# Patient Record
Sex: Female | Born: 1940 | Hispanic: No | Marital: Married | Smoking: Never smoker
Health system: Southern US, Community
[De-identification: ages and names within clinical notes are randomized; demographics above are authoritative.]

## PROBLEM LIST (undated history)

## (undated) DIAGNOSIS — I639 Cerebral infarction, unspecified: Secondary | ICD-10-CM

## (undated) DIAGNOSIS — E785 Hyperlipidemia, unspecified: Secondary | ICD-10-CM

## (undated) DIAGNOSIS — F329 Major depressive disorder, single episode, unspecified: Secondary | ICD-10-CM

## (undated) DIAGNOSIS — F32A Depression, unspecified: Secondary | ICD-10-CM

## (undated) HISTORY — DX: Depression, unspecified: F32.A

## (undated) HISTORY — DX: Cerebral infarction, unspecified: I63.9

## (undated) HISTORY — DX: Major depressive disorder, single episode, unspecified: F32.9

---

## 1999-04-10 ENCOUNTER — Other Ambulatory Visit: Admission: RE | Admit: 1999-04-10 | Discharge: 1999-04-10 | Payer: Self-pay | Admitting: Obstetrics & Gynecology

## 1999-08-01 ENCOUNTER — Other Ambulatory Visit: Admission: RE | Admit: 1999-08-01 | Discharge: 1999-08-01 | Payer: Self-pay | Admitting: Obstetrics & Gynecology

## 2000-10-13 ENCOUNTER — Other Ambulatory Visit: Admission: RE | Admit: 2000-10-13 | Discharge: 2000-10-13 | Payer: Self-pay | Admitting: Obstetrics & Gynecology

## 2001-10-14 ENCOUNTER — Other Ambulatory Visit: Admission: RE | Admit: 2001-10-14 | Discharge: 2001-10-14 | Payer: Self-pay | Admitting: Obstetrics & Gynecology

## 2002-11-08 ENCOUNTER — Other Ambulatory Visit: Admission: RE | Admit: 2002-11-08 | Discharge: 2002-11-08 | Payer: Self-pay | Admitting: Obstetrics & Gynecology

## 2003-11-16 ENCOUNTER — Other Ambulatory Visit: Admission: RE | Admit: 2003-11-16 | Discharge: 2003-11-16 | Payer: Self-pay | Admitting: Obstetrics & Gynecology

## 2004-11-05 ENCOUNTER — Other Ambulatory Visit: Admission: RE | Admit: 2004-11-05 | Discharge: 2004-11-05 | Payer: Self-pay | Admitting: Obstetrics & Gynecology

## 2005-11-12 ENCOUNTER — Other Ambulatory Visit: Admission: RE | Admit: 2005-11-12 | Discharge: 2005-11-12 | Payer: Self-pay | Admitting: Obstetrics & Gynecology

## 2008-04-02 ENCOUNTER — Encounter: Admission: RE | Admit: 2008-04-02 | Discharge: 2008-04-02 | Payer: Self-pay | Admitting: Family Medicine

## 2012-01-26 ENCOUNTER — Other Ambulatory Visit (HOSPITAL_COMMUNITY): Payer: Self-pay | Admitting: Gastroenterology

## 2012-01-26 ENCOUNTER — Ambulatory Visit (HOSPITAL_COMMUNITY)
Admission: RE | Admit: 2012-01-26 | Discharge: 2012-01-26 | Disposition: A | Discharge status: Home / Self Care | Payer: Medicare Other | Source: Ambulatory Visit | Attending: Gastroenterology | Admitting: Gastroenterology

## 2012-01-26 DIAGNOSIS — R933 Abnormal findings on diagnostic imaging of other parts of digestive tract: Secondary | ICD-10-CM

## 2012-01-26 DIAGNOSIS — K6389 Other specified diseases of intestine: Secondary | ICD-10-CM | POA: Insufficient documentation

## 2012-01-26 DIAGNOSIS — Z09 Encounter for follow-up examination after completed treatment for conditions other than malignant neoplasm: Secondary | ICD-10-CM | POA: Insufficient documentation

## 2014-10-05 ENCOUNTER — Other Ambulatory Visit (HOSPITAL_COMMUNITY): Payer: Self-pay | Admitting: Family Medicine

## 2014-10-05 DIAGNOSIS — R1310 Dysphagia, unspecified: Secondary | ICD-10-CM

## 2014-10-10 ENCOUNTER — Ambulatory Visit (HOSPITAL_COMMUNITY): Payer: Medicare Other

## 2014-10-11 ENCOUNTER — Ambulatory Visit (HOSPITAL_COMMUNITY)
Admission: RE | Admit: 2014-10-11 | Discharge: 2014-10-11 | Disposition: A | Discharge status: Home / Self Care | Payer: Medicare Other | Source: Ambulatory Visit | Attending: Family Medicine | Admitting: Family Medicine

## 2014-10-11 DIAGNOSIS — R05 Cough: Secondary | ICD-10-CM | POA: Insufficient documentation

## 2014-10-11 DIAGNOSIS — R1310 Dysphagia, unspecified: Secondary | ICD-10-CM | POA: Insufficient documentation

## 2014-10-11 NOTE — Procedures (Signed)
Objective Swallowing Evaluation: Modified Barium Swallowing Study  Patient Details  Name: Rhonda Fuller MRN: [id number] Date of Birth: 06/21/1941  Today's Date: 10/11/2014 Time: 1300-1330 SLP Time Calculation (min): 30 min  Past Medical History: No past medical history on file. Past Surgical History: No past surgical history on file. HPI:  This 73 year old female was referred by Dr. Jeannetta NapElkins for OP MBS secondary to pt c/o frequent coughing during meals.  Pt also states she has heartburn and a stinging sensation, pointing to her sternum up to her hyoid, after eating.  No other significant PMH reported.     Assessment / Plan / Recommendation Clinical Impression  Dysphagia Diagnosis: Suspected primary esophageal dysphagia Clinical impression: Pt exhibits normal oropharyngeal swallow function, with no difficulty chewing, forming and manipulating solid or liquid boluses, timely swallow initiation, no penetration or aspiration, and no pharyngeal residue after the swallow.  The patient had a dry, frequent cough multiple times during this study, but there was no evidence of aspiration or penetration.  Upon esophageal sweep, after swallowing a pill, esophageal stasis was noted, with suspected decreased esophageal perestalsis.  SLP defered esophageal assesment to the Radiologist, who stated further evaluation may be needed.      Treatment Recommendation  No treatment recommended at this time    Diet Recommendation Regular;Thin liquid   Liquid Administration via: Cup;Straw Medication Administration: Whole meds with liquid Supervision: Patient able to self feed Compensations: Slow rate;Small sips/bites;Follow solids with liquid Postural Changes and/or Swallow Maneuvers: Seated upright 90 degrees;Upright 30-60 min after meal    Other  Recommendations Recommended Consults: Consider GI evaluation;Consider esophageal assessment Oral Care Recommendations: Oral care BID Other Recommendations: Clarify  dietary restrictions   Follow Up Recommendations  None    Frequency and Duration        Pertinent Vitals/Pain n/a         General HPI: This 73 year old female was referred by Dr. Jeannetta NapElkins for OP MBS secondary to pt c/o frequent coughing during meals.  Pt also states she has heartburn and a stinging sensation, pointing to her sternum up to her hyoid, after eating.  No other significant PMH reported. Type of Study: Modified Barium Swallowing Study Reason for Referral: Objectively evaluate swallowing function Previous Swallow Assessment: none Diet Prior to this Study: Regular;Thin liquids Temperature Spikes Noted: No Respiratory Status: Room air History of Recent Intubation: No Behavior/Cognition: Alert;Cooperative;Pleasant mood Oral Cavity - Dentition: Adequate natural dentition Oral Motor / Sensory Function: Within functional limits Self-Feeding Abilities: Able to feed self Patient Positioning: Upright in chair Baseline Vocal Quality: Clear Volitional Cough: Strong Volitional Swallow: Able to elicit Anatomy: Within functional limits Pharyngeal Secretions: Not observed secondary MBS    Reason for Referral Objectively evaluate swallowing function   Oral Phase Oral Preparation/Oral Phase Oral Phase: WFL   Pharyngeal Phase Pharyngeal Phase Pharyngeal Phase: Within functional limits  Cervical Esophageal Phase    GO    Cervical Esophageal Phase Cervical Esophageal [facility]    Functional Assessment Tool Used: MBS, Clinical judgement Functional Limitations: Swallowing Swallow Current Status (W0981(G8996): 0 percent impaired, limited or restricted Swallow Goal Status (X9147(G8997): 0 percent impaired, limited or restricted Swallow Discharge Status (W2956(G8998): 0 percent impaired, limited or restricted    Rhonda Fuller, Rhonda Fuller 10/11/2014, 1:53 PM

## 2015-05-26 ENCOUNTER — Inpatient Hospital Stay (HOSPITAL_COMMUNITY): Payer: Medicare Other

## 2015-05-26 ENCOUNTER — Encounter (HOSPITAL_COMMUNITY): Payer: Self-pay | Admitting: *Deleted

## 2015-05-26 ENCOUNTER — Inpatient Hospital Stay (HOSPITAL_COMMUNITY)
Admission: EM | Admit: 2015-05-26 | Discharge: 2015-05-28 | DRG: 066 | Disposition: A | Discharge status: Home / Self Care | Payer: Medicare Other | Attending: Family Medicine | Admitting: Family Medicine

## 2015-05-26 ENCOUNTER — Emergency Department (HOSPITAL_COMMUNITY): Payer: Medicare Other

## 2015-05-26 DIAGNOSIS — Z79899 Other long term (current) drug therapy: Secondary | ICD-10-CM

## 2015-05-26 DIAGNOSIS — Z823 Family history of stroke: Secondary | ICD-10-CM

## 2015-05-26 DIAGNOSIS — Z885 Allergy status to narcotic agent status: Secondary | ICD-10-CM | POA: Diagnosis not present

## 2015-05-26 DIAGNOSIS — I6340 Cerebral infarction due to embolism of unspecified cerebral artery: Secondary | ICD-10-CM | POA: Diagnosis present

## 2015-05-26 DIAGNOSIS — I10 Essential (primary) hypertension: Secondary | ICD-10-CM | POA: Diagnosis present

## 2015-05-26 DIAGNOSIS — E785 Hyperlipidemia, unspecified: Secondary | ICD-10-CM | POA: Diagnosis present

## 2015-05-26 DIAGNOSIS — K219 Gastro-esophageal reflux disease without esophagitis: Secondary | ICD-10-CM | POA: Diagnosis present

## 2015-05-26 DIAGNOSIS — I639 Cerebral infarction, unspecified: Secondary | ICD-10-CM | POA: Diagnosis present

## 2015-05-26 DIAGNOSIS — I739 Peripheral vascular disease, unspecified: Secondary | ICD-10-CM | POA: Diagnosis present

## 2015-05-26 DIAGNOSIS — E780 Pure hypercholesterolemia: Secondary | ICD-10-CM | POA: Diagnosis present

## 2015-05-26 DIAGNOSIS — R471 Dysarthria and anarthria: Secondary | ICD-10-CM | POA: Diagnosis present

## 2015-05-26 HISTORY — DX: Hyperlipidemia, unspecified: E78.5

## 2015-05-26 LAB — PROTIME-INR
INR: 0.97 (ref 0.00–1.49)
Prothrombin Time: 13.1 seconds (ref 11.6–15.2)

## 2015-05-26 LAB — COMPREHENSIVE METABOLIC PANEL
ALT: 13 U/L — ABNORMAL LOW (ref 14–54)
ANION GAP: 9 (ref 5–15)
AST: 15 U/L (ref 15–41)
Albumin: 4.1 g/dL (ref 3.5–5.0)
Alkaline Phosphatase: 71 U/L (ref 38–126)
BILIRUBIN TOTAL: 0.3 mg/dL (ref 0.3–1.2)
BUN: 12 mg/dL (ref 6–20)
CHLORIDE: 107 mmol/L (ref 101–111)
CO2: 24 mmol/L (ref 22–32)
CREATININE: 0.72 mg/dL (ref 0.44–1.00)
Calcium: 9.6 mg/dL (ref 8.9–10.3)
GFR calc Af Amer: >60 mL/min (ref >60)
Glucose, Bld: 103 mg/dL — ABNORMAL HIGH (ref 65–99)
Potassium: 3.7 mmol/L (ref 3.5–5.1)
Sodium: 140 mmol/L (ref 135–145)
Total Protein: 7.7 g/dL (ref 6.5–8.1)

## 2015-05-26 LAB — CBC
HCT: 42.3 % (ref 36.0–46.0)
HEMOGLOBIN: 14.1 g/dL (ref 12.0–15.0)
MCH: 29.4 pg (ref 26.0–34.0)
MCHC: 33.3 g/dL (ref 30.0–36.0)
MCV: 88.1 fL (ref 78.0–100.0)
Platelets: 324 10*3/uL (ref 150–400)
RBC: 4.8 MIL/uL (ref 3.87–5.11)
RDW: 14 % (ref 11.5–15.5)
WBC: 8.4 10*3/uL (ref 4.0–10.5)

## 2015-05-26 LAB — DIFFERENTIAL
BASOS PCT: 1 % (ref 0–1)
Basophils Absolute: 0.1 10*3/uL (ref 0.0–0.1)
Eosinophils Absolute: 0.2 10*3/uL (ref 0.0–0.7)
Eosinophils Relative: 2 % (ref 0–5)
LYMPHS ABS: 2.5 10*3/uL (ref 0.7–4.0)
LYMPHS PCT: 30 % (ref 12–46)
MONO ABS: 0.7 10*3/uL (ref 0.1–1.0)
MONOS PCT: 8 % (ref 3–12)
NEUTROS ABS: 4.9 10*3/uL (ref 1.7–7.7)
Neutrophils Relative %: 59 % (ref 43–77)

## 2015-05-26 LAB — APTT: aPTT: 30 seconds (ref 24–37)

## 2015-05-26 LAB — CBG MONITORING, ED: GLUCOSE-CAPILLARY: 108 mg/dL — AB (ref 65–99)

## 2015-05-26 LAB — I-STAT TROPONIN, ED: TROPONIN I, POC: 0 ng/mL (ref 0.00–0.08)

## 2015-05-26 MED ORDER — LORAZEPAM 0.5 MG PO TABS
0.5000 mg | ORAL_TABLET | ORAL | Status: DC | PRN
Start: 2015-05-26 — End: 2015-05-28
  Administered 2015-05-26: 0.5 mg via ORAL
  Filled 2015-05-26: qty 1

## 2015-05-26 MED ORDER — HEPARIN SODIUM (PORCINE) 5000 UNIT/ML IJ SOLN
5000.0000 [IU] | Freq: Three times a day (TID) | INTRAMUSCULAR | Status: DC
  Administered 2015-05-26 – 2015-05-28 (×5): 5000 [IU] via SUBCUTANEOUS
  Filled 2015-05-26 (×5): qty 1

## 2015-05-26 MED ORDER — ASPIRIN 325 MG PO TABS
325.0000 mg | ORAL_TABLET | Freq: Every day | ORAL | Status: DC
  Administered 2015-05-26 – 2015-05-28 (×3): 325 mg via ORAL
  Filled 2015-05-26 (×3): qty 1

## 2015-05-26 MED ORDER — STROKE: EARLY STAGES OF RECOVERY BOOK
Freq: Once | Status: AC
  Administered 2015-05-26: 22:00:00

## 2015-05-26 MED ORDER — PANTOPRAZOLE SODIUM 40 MG PO TBEC
40.0000 mg | DELAYED_RELEASE_TABLET | Freq: Every day | ORAL | Status: DC
  Administered 2015-05-27 – 2015-05-28 (×2): 40 mg via ORAL
  Filled 2015-05-26 (×2): qty 1

## 2015-05-26 MED ORDER — PRAVASTATIN SODIUM 40 MG PO TABS
40.0000 mg | ORAL_TABLET | Freq: Every day | ORAL | Status: DC
  Administered 2015-05-26: 40 mg via ORAL
  Filled 2015-05-26: qty 1

## 2015-05-26 MED ORDER — ASPIRIN 300 MG RE SUPP: 300.0000 mg | Freq: Every day | RECTAL | Status: DC

## 2015-05-26 MED ORDER — SENNOSIDES-DOCUSATE SODIUM 8.6-50 MG PO TABS: 1.0000 | ORAL_TABLET | Freq: Every evening | ORAL | Status: DC | PRN

## 2015-05-26 NOTE — ED Provider Notes (Signed)
CSN: [id number]     Arrival date & time 05/26/15  1724 History   First MD Initiated Contact with Patient 05/26/15 1751     Chief Complaint  Patient presents with  . Aphasia  . Extremity Weakness     (Consider location/radiation/quality/duration/timing/severity/associated sxs/prior Treatment) HPI  74 year old female presents with slurred speech since waking up this morning. Last seen normal around 10 PM last night. She has also been leaning to the left. Patient has not noticed dropping anything or left upper extremity weakness. Family states she has not been confused but a definite is a change in her voice. She has a past history of high cholesterol as well as a significant family history for stroke. She has not known of any prior strokes. She has been complaining of a moderate headache for the past 3 days.  Past Medical History  Diagnosis Date  . Hyperlipidemia    History reviewed. No pertinent past surgical history. No family history on file. History  Substance Use Topics  . Smoking status: Never Smoker   . Smokeless tobacco: Not on file  . Alcohol Use: No   OB History    No data available     Review of Systems  Musculoskeletal: Positive for gait problem.  Neurological: Positive for speech difficulty, weakness and headaches.  All other systems reviewed and are negative.     Allergies  Codeine  Home Medications   Prior to Admission medications   Not on File   BP 178/78 mmHg  Pulse 98  Temp(Src) 99.2 F (37.3 C) (Oral)  Resp 16  Ht  (1.575 m)  Wt 140 lb 8 oz (63.73 kg)  BMI 25.69 kg/m2  SpO2 98% Physical Exam  Constitutional: She is oriented to person, place, and time. She appears well-developed and well-nourished.  HENT:  Head: Normocephalic and atraumatic.  Right Ear: External ear normal.  Left Ear: External ear normal.  Nose: Nose normal.  Eyes: EOM are normal. Pupils are equal, round, and reactive to light. Right eye exhibits no discharge. Left  eye exhibits no discharge.  Cardiovascular: Normal rate, regular rhythm and normal heart sounds.   Pulmonary/Chest: Effort normal and breath sounds normal.  Abdominal: Soft. She exhibits no distension. There is no tenderness.  Neurological: She is alert and oriented to person, place, and time.  CN 2-12 grossly intact. No facial droop. Subtle weakness in LLE compared to Right but still 5/5. Normal strength in upper extremities. No pronator drift. Mildly slurred speech  Skin: Skin is warm and dry.  Nursing note and vitals reviewed.   ED Course  Procedures (including critical care time) Labs Review Labs Reviewed  COMPREHENSIVE METABOLIC PANEL - Abnormal; Notable for the following:    Glucose, Bld 103 (*)    ALT 13 (*)    All other components within normal limits  CBG MONITORING, ED - Abnormal; Notable for the following:    Glucose-Capillary 108 (*)    All other components within normal limits  PROTIME-INR  APTT  CBC  DIFFERENTIAL  I-STAT CHEM 8, ED  I-STAT TROPOININ, ED    Imaging Review Ct Head Wo Contrast  05/26/2015   CLINICAL DATA:  One day history of left-sided weakness and aphasia  EXAM: CT HEAD WITHOUT CONTRAST  TECHNIQUE: Contiguous axial images were obtained from the base of the skull through the vertex without intravenous contrast.  COMPARISON:  None.  FINDINGS: There is age related volume loss. The left lateral ventricle is slightly larger than the right lateral  ventricle, an apparent anatomic variant. There is no intracranial mass, hemorrhage, extra-axial fluid collection, or midline shift. There is decreased attenuation extending from the superior right lentiform nucleus into the inferior right centrum semiovale. This finding is suspicious for an early/acute infarct. Elsewhere, gray-white compartments appear normal. Bony calvarium appears intact. The mastoid air cells are clear.  IMPRESSION: Probable acute/recent infarct extending from the superior right lentiform nucleus  into the inferior right centrum semiovale more medially. No hemorrhage or mass effect. Age related volume loss present.   Electronically Signed   By: Bretta Bang III M.D.   On: 05/26/2015 19:05     EKG Interpretation   Date/Time:  Saturday May 26 2015 17:36:09 EDT Ventricular Rate:  102 PR Interval:  136 QRS Duration: 78 QT Interval:  338 QTC Calculation: 440 R Axis:   71 Text Interpretation:  Sinus tachycardia Otherwise normal ECG No old  tracing to compare Confirmed by Erilyn Pearman  MD, Ovetta Bazzano ([id number]) on 05/26/2015  5:51:22 PM      MDM   Final diagnoses:  Cerebral infarction due to unspecified mechanism    CT shows evidence of acute infarct. Given that her last seen normal was over 12 hours ago she is not a code stroke candidate. The patient does have risk factors with hypertension and hypercholesterolemia. Neurology was consulted, patient will be admitted to medicine for further stroke workup.    Pricilla Loveless, MD 05/26/15 1944

## 2015-05-26 NOTE — ED Notes (Signed)
Dr. Kirkpatrick at bedside 

## 2015-05-26 NOTE — ED Notes (Signed)
Pt states that she went to bed last night and woke this morning with slurred speech and difficulty walking. Pt states that she feels she is leaning to the left. Pt noted to be weak on the left side in triage.

## 2015-05-26 NOTE — Progress Notes (Signed)
Pt transferred to unit from ED. Pt alert and oriented upon arrival. No complaints of pain or discomfort. No signs or symptoms of acute distress. Pt connected to telemetry and central monitoring notified. Pt oriented to unit as well as unit procedures. Pt now resting in bed at lowest position, bed alarm on, call light in reach. Will continue to monitor. Delfino Lovett, RN, BSN 05/26/2015 10:04 PM

## 2015-05-26 NOTE — Consult Note (Signed)
Neurology Consultation Reason for Consult: Stroke Referring Physician: Everardo Pacific  CC: Dysarthria  History is obtained from: Patient  HPI: YECENIA LUKASIK is a 74 y.o. female who has been expressing dysarthria since she awoke this morning. She also notices that when she walks it feels like she is being pulled to the left. She states that this is metastatic deficits since this morning. She had considered waiting until Monday to see if she got better, but her family convinced her to come in to the ER to seek further attention. She also noticed that while she was typing her left hand was clumsy and she Making mistakes with it.  Here she was found to be severely hypertensive, though she does not take hypertension medication. CT scan showed no hemorrhage and neurology is being consulted for additional recommendations.   LKW: 6/17 prior to bed tpa given?: no, outside of window    ROS: A 14 point ROS was performed and is negative except as noted in the HPI.   Past Medical History  Diagnosis Date  . Hyperlipidemia     Family History: Multiple family members with heart attacks  Social History: Tob: Denies  Exam: Current vital signs: BP 164/85 mmHg  Pulse 83  Temp(Src) 99.2 F (37.3 C) (Oral)  Resp 18  Ht 5\' 2"  (1.575 m)  Wt 63.73 kg (140 lb 8 oz)  BMI 25.69 kg/m2  SpO2 97% Vital signs in last 24 hours: Temp:  [97.6 F (36.4 C)-99.2 F (37.3 C)] 99.2 F (37.3 C) (06/18 1800) Pulse Rate:  [83-98] 83 (06/18 1815) Resp:  [16-18] 18 (06/18 1815) BP: (164-178)/(78-86) 164/85 mmHg (06/18 1815) SpO2:  [97 %-98 %] 97 % (06/18 1815) Weight:  [63.73 kg (140 lb 8 oz)] 63.73 kg (140 lb 8 oz) (06/18 1735)   Physical Exam  Constitutional: Appears well-developed and well-nourished.  Psych: Affect appropriate to situation Eyes: No scleral injection HENT: No OP obstrucion Head: Normocephalic.  Cardiovascular: Normal rate and regular rhythm.  Respiratory: Effort normal and breath  sounds normal to anterior ascultation GI: Soft.  No distension. There is no tenderness.  Skin: WDI  Neuro: Mental Status: Patient is awake, alert, oriented to person, place, month, year, and situation. Patient is able to give a clear and coherent history. No signs of aphasia or neglect Cranial Nerves: II: Visual Fields are full. Pupils are equal, round, and reactive to light.   III,IV, VI: EOMI without ptosis or diploplia.  V: Facial sensation is symmetric to temperature VII: Facial movement is notable for questionable decreased nasolabial formal left VIII: hearing is intact to voice X: Uvula elevates symmetrically XI: Shoulder shrug is symmetric. XII: tongue is midline without atrophy or fasciculations.  Motor: Tone is normal. Bulk is normal. 5/5 strength was present in bilateral arms and right leg, she does have 4/5 strength in her left leg Sensory: Sensation is symmetric to light touch and temperature in the arms. She has decreased pinprick in the left leg Cerebellar: FNF is slow in the left arm, but with no dysmetria, intact in all other extremities   I have reviewed labs in epic and the results pertinent to this consultation are: CBG-108  I have reviewed the images obtained: CT head-no hemorrhage  Impression: 74 year old female who is likely had a small right hemispheric stroke. She will need to be admitted for further workup.  Recommendations: 1. HgbA1c, fasting lipid panel 2. MRI, MRA  of the brain without contrast 3. Frequent neuro checks 4. Echocardiogram 5. Carotid  dopplers 6. Prophylactic therapy-Antiplatelet med: Aspirin - dose  PO or  PR 7. Risk factor modification 8. Telemetry monitoring 9. PT consult, OT consult, Speech consult 10. Permissive hypertension    Ritta Slot, MD [facility] [phone]  If 7pm- 7am, please page neurology on call as listed in AMION.

## 2015-05-27 ENCOUNTER — Inpatient Hospital Stay (HOSPITAL_COMMUNITY): Payer: Medicare Other

## 2015-05-27 ENCOUNTER — Inpatient Hospital Stay (INDEPENDENT_AMBULATORY_CARE_PROVIDER_SITE_OTHER): Payer: Medicare Other

## 2015-05-27 DIAGNOSIS — E785 Hyperlipidemia, unspecified: Secondary | ICD-10-CM

## 2015-05-27 DIAGNOSIS — I10 Essential (primary) hypertension: Secondary | ICD-10-CM | POA: Diagnosis not present

## 2015-05-27 DIAGNOSIS — K219 Gastro-esophageal reflux disease without esophagitis: Secondary | ICD-10-CM

## 2015-05-27 DIAGNOSIS — I639 Cerebral infarction, unspecified: Secondary | ICD-10-CM

## 2015-05-27 LAB — CBC
HEMATOCRIT: 41.1 % (ref 36.0–46.0)
Hemoglobin: 13.5 g/dL (ref 12.0–15.0)
MCH: 29 pg (ref 26.0–34.0)
MCHC: 32.8 g/dL (ref 30.0–36.0)
MCV: 88.2 fL (ref 78.0–100.0)
PLATELETS: 294 10*3/uL (ref 150–400)
RBC: 4.66 MIL/uL (ref 3.87–5.11)
RDW: 14.1 % (ref 11.5–15.5)
WBC: 8.4 10*3/uL (ref 4.0–10.5)

## 2015-05-27 LAB — COMPREHENSIVE METABOLIC PANEL
ALT: 15 U/L (ref 14–54)
ANION GAP: 7 (ref 5–15)
AST: 16 U/L (ref 15–41)
Albumin: 3.8 g/dL (ref 3.5–5.0)
Alkaline Phosphatase: 61 U/L (ref 38–126)
BUN: 12 mg/dL (ref 6–20)
CHLORIDE: 106 mmol/L (ref 101–111)
CO2: 27 mmol/L (ref 22–32)
CREATININE: 0.91 mg/dL (ref 0.44–1.00)
Calcium: 9.6 mg/dL (ref 8.9–10.3)
GFR calc non Af Amer: >60 mL/min (ref >60)
GLUCOSE: 112 mg/dL — AB (ref 65–99)
POTASSIUM: 3.9 mmol/L (ref 3.5–5.1)
Sodium: 140 mmol/L (ref 135–145)
Total Bilirubin: 0.5 mg/dL (ref 0.3–1.2)
Total Protein: 7 g/dL (ref 6.5–8.1)

## 2015-05-27 LAB — LIPID PANEL
CHOL/HDL RATIO: 4.3 ratio
Cholesterol: 186 mg/dL (ref 0–200)
HDL: 43 mg/dL (ref >40)
LDL Cholesterol: 102 mg/dL — ABNORMAL HIGH (ref 0–99)
Triglycerides: 203 mg/dL — ABNORMAL HIGH (ref <150)
VLDL: 41 mg/dL — ABNORMAL HIGH (ref 0–40)

## 2015-05-27 MED ORDER — PRAVASTATIN SODIUM 40 MG PO TABS
80.0000 mg | ORAL_TABLET | Freq: Every day | ORAL | Status: DC
  Administered 2015-05-27: 80 mg via ORAL
  Filled 2015-05-27: qty 2

## 2015-05-27 NOTE — Evaluation (Signed)
Physical Therapy Evaluation Patient Details Name: Rhonda Fuller MRN: [id number] DOB: 1941-07-22 Today's Date: 05/27/2015   History of Present Illness  pt is a 74 y/o female admitted with c/o slurred speech, swaying to the Left, ?left sided incoordination.  CT showed L lentiform/ insular infarct M2 distribution.  Clinical Impression  Pt admitted with/for above s/s and CT confirmed L infarct in M2 distribution.  Pt currently limited functionally due to the problems listed below.  (see problems list.)  Pt will benefit from PT to maximize function and safety to be able to get home safely with available assist of family.     Follow Up Recommendations Supervision for mobility/OOB    Equipment Recommendations  None recommended by PT    Recommendations for Other Services       Precautions / Restrictions Precautions Precautions:  (suspect mild fall risk.)      Mobility  Bed Mobility Overal bed mobility: Modified Independent                Transfers Overall transfer level: Modified independent                  Ambulation/Gait Ambulation/Gait assistance: Supervision Ambulation Distance (Feet): 500 Feet Assistive device: None Gait Pattern/deviations: Step-through pattern Gait velocity: wfl   General Gait Details: generally steady with mild L side incoordination on swing and foot contact,   Stairs Stairs: Yes Stairs assistance: Supervision Stair Management: One rail Right;Alternating pattern;Forwards Number of Stairs: 4 General stair comments: safe with rail  Wheelchair Mobility    Modified Rankin (Stroke Patients Only) Modified Rankin (Stroke Patients Only) Pre-Morbid Rankin Score: No symptoms Modified Rankin: Slight disability     Balance Overall balance assessment: Needs assistance Sitting-balance support: No upper extremity supported Sitting balance-Leahy Scale: Good     Standing balance support: No upper extremity supported Standing balance-Leahy  Scale: Fair                 High Level Balance Comments: no LOB with scanning , cadence changes and backing up.             Pertinent Vitals/Pain Pain Assessment: No/denies pain    Home Living Family/patient expects to be discharged to:: Private residence Living Arrangements: Spouse/significant other Available Help at Discharge: Family;Available 24 hours/day Type of Home: House Home Access: Stairs to enter Entrance Stairs-Rails: Doctor, general practice of Steps: 2 Home Layout: One level Home Equipment: Cane - single point      Prior Function Level of Independence: Independent         Comments: active     Hand Dominance        Extremity/Trunk Assessment   Upper Extremity Assessment: Overall WFL for tasks assessed;LUE deficits/detail       LUE Deficits / Details: mild incoordination--finger/thumb approx., elbow flexion   Lower Extremity Assessment: Overall WFL for tasks assessed;LLE deficits/detail   LLE Deficits / Details: mild incoordination seen mostly in gait with foot flat placement     Communication   Communication: Other (comment) (mildly slurred speech)  Cognition Arousal/Alertness: Awake/alert Behavior During Therapy: WFL for tasks assessed/performed Overall Cognitive Status: Within Functional Limits for tasks assessed                      General Comments      Exercises        Assessment/Plan    PT Assessment Patient needs continued PT services  PT Diagnosis Other (comment) (mild L side hemiparesis/incoordination)  PT Problem List Decreased activity tolerance;Decreased coordination;Decreased balance  PT Treatment Interventions Gait training;Functional mobility training;Therapeutic activities;Patient/family education   PT Goals (Current goals can be found in the Care Plan section) Acute Rehab PT Goals Patient Stated Goal: independence PT Goal Formulation: With patient Time For Goal Achievement:  05/30/15 Potential to Achieve Goals: Good    Frequency Min 3X/week   Barriers to discharge        Co-evaluation               End of Session   Activity Tolerance: Patient tolerated treatment well Patient left: in bed;with call bell/phone within reach;with family/visitor present Nurse Communication: Mobility status         Time: 9480-1655 PT Time Calculation (min) (ACUTE ONLY): 20 min   Charges:   PT Evaluation $Initial PT Evaluation Tier I: 1 Procedure     PT G Codes:        Shamieka Gullo, Eliseo Gum 05/27/2015, 1:48 PM 05/27/2015  [place], [place] [phone]  (pager)

## 2015-05-27 NOTE — Progress Notes (Signed)
STROKE TEAM PROGRESS NOTE   HISTORY Rhonda Fuller is a 74 y.o. female who has been expressing dysarthria since she awoke this morning. She also notices that when she walks it feels like she is being pulled to the left. She states that this is metastatic deficits since this morning. She had considered waiting until Monday to see if she got better, but her family convinced her to come in to the ER to seek further attention. She also noticed that while she was typing her left hand was clumsy and she Making mistakes with it.  Here she was found to be severely hypertensive, though she does not take hypertension medication. CT scan showed no hemorrhage and neurology is being consulted for additional recommendations.   LKW: 6/17 prior to bed tpa given?: no, outside of window    SUBJECTIVE (INTERVAL HISTORY Several family members at the bedside.  Overall she feels her condition is rapidly improving.    OBJECTIVE Temp:  [97.6 F (36.4 C)-99.5 F (37.5 C)] 98.4 F (36.9 C) (06/19 0933) Pulse Rate:  [76-98] 96 (06/19 0933) Cardiac Rhythm:  [-] Normal sinus rhythm (06/19 0900) Resp:  [15-18] 16 (06/19 0933) BP: (132-178)/(63-86) 154/75 mmHg (06/19 0933) SpO2:  [96 %-99 %] 96 % (06/19 0933) Weight:  [62.732 kg (138 lb 4.8 oz)-63.73 kg (140 lb 8 oz)] 62.732 kg (138 lb 4.8 oz) (06/18 2105)   Recent Labs Lab 05/26/15 1745  GLUCAP 108*    Recent Labs Lab 05/26/15 1742 05/27/15 0547  NA 140 140  K 3.7 3.9  CL 107 106  CO2 24 27  GLUCOSE 103* 112*  BUN 12 12  CREATININE 0.72 0.91  CALCIUM 9.6 9.6    Recent Labs Lab 05/26/15 1742 05/27/15 0547  AST 15 16  ALT 13* 15  ALKPHOS 71 61  BILITOT 0.3 0.5  PROT 7.7 7.0  ALBUMIN 4.1 3.8    Recent Labs Lab 05/26/15 1742 05/27/15 0547  WBC 8.4 8.4  NEUTROABS 4.9  --   HGB 14.1 13.5  HCT 42.3 41.1  MCV 88.1 88.2  PLT 324 294   No results for input(s): CKTOTAL, CKMB, CKMBINDEX, TROPONINI in the last 168 hours.  Recent Labs   05/26/15 1742  LABPROT 13.1  INR 0.97   No results for input(s): COLORURINE, LABSPEC, PHURINE, GLUCOSEU, HGBUR, BILIRUBINUR, KETONESUR, PROTEINUR, UROBILINOGEN, NITRITE, LEUKOCYTESUR in the last 72 hours.  Invalid input(s): APPERANCEUR     Component Value Date/Time   CHOL 186 05/27/2015 0547   TRIG 203* 05/27/2015 0547   HDL 43 05/27/2015 0547   CHOLHDL 4.3 05/27/2015 0547   VLDL 41* 05/27/2015 0547   LDLCALC 102* 05/27/2015 0547   No results found for: HGBA1C No results found for: LABOPIA, COCAINSCRNUR, LABBENZ, AMPHETMU, THCU, LABBARB  No results for input(s): ETH in the last 168 hours.   Imaging   Ct Head Wo Contrast 05/26/2015    Probable acute/recent infarct extending from the superior right lentiform nucleus into the inferior right centrum semiovale more medially. No hemorrhage or mass effect. Age related volume loss present.       [name] 05/27/2015    MRI HEAD IMPRESSION:   1. 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 No associated hemorrhage or significant mass effect.  2. No other acute intracranial process.  3. Age appropriate cerebral atrophy with mild chronic small vessel ischemic disease.    MRA HEAD IMPRESSION:  1. No large vessel occlusion or hemodynamically significant stenosis within the intracranial circulation.  2. Mild atheromatous irregularity within the right M1 segment and distal right MCA branches.  3. Fetal origin of the right PCA.       PHYSICAL EXAM  GENERAL: Pleasant no distress.  HEENT: normal  ABDOMEN: soft  EXTREMITIES: No edema   BACK: normal  SKIN: Normal by inspection.    MENTAL STATUS: Alert and oriented. Speech -mild dysarthria, language and cognition are generally intact. Judgment and insight normal.   CRANIAL NERVES: Pupils are equal, round and reactive to light and accomodation; extra ocular  movements are full, there is no significant nystagmus; visual fields are full; upper and lower facial muscles are normal in strength and symmetric, there is no flattening of the nasolabial folds; tongue is midline; uvula is midline; shoulder elevation is normal.  MOTOR: Normal tone, bulk and strength; no pronator drift.  COORDINATION: Left finger to nose is normal, right finger to nose is normal, No rest tremor; no intention tremor; no postural tremor; no bradykinesia. Subtle impairment of fine finger movements and finger tapping.   SENSATION: Normal to light touch and temperature.      ASSESSMENT/PLAN [name] is a 74 y.o. female with history of hyperlipidemia and hypertension presenting with dysarthria and clumsy left hand.  She did not receive IV t-PA due to late presentation.  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  Resultant  Clumsy hand dysarthria.   MRI  Acute ischemic infarct involving the right lentiform nucleus with superior extension  MRA  no large vessel occlusion or significant stenosis.  Carotid Doppler pending  2D Echo pending  LDL 102  HgbA1c pending  Subcutaneous heparin for VTE prophylaxis  Diet Heart Room service appropriate?: Yes; Fluid consistency:: Thin  no antithrombotic prior to admission, now on aspirin 325 mg orally every day  Patient counseled to be compliant with her antithrombotic medications  Ongoing aggressive stroke risk factor management  Therapy recommendations: Pending  Disposition:  Pending  Hypertension  Home meds: No antihypertensives medications prior to admission.  Stable   Hyperlipidemia  Home meds:  Pravachol 40 mg daily resumed in hospital  LDL 102 goal < 70  Increase Pravachol to 80 mg daily  Continue statin at discharge   Other Stroke Risk Factors  Advanced age  Family hx stroke (Both parents had strokes)   Other Active Problems    Other Pertinent  History    Hospital day # 1  Dr. Gerilyn Pilgrim -- The patient was seen and examined by me; notes, chart and tests reviewed and discussed with midlevel provider, other providers, patient, and family.       To contact Stroke Continuity provider, please refer to [email or web address]. After hours, contact General Neurology

## 2015-05-27 NOTE — H&P (Signed)
[facility] History and Physical  Patient: Rhonda Fuller  MRN: [id number]  DOB: 16-Oct-1941  DOS: the patient was seen and examined on 05/26/2015 PCP: [name], MD  Referring physician: Dr. Criss Alvine Chief Complaint: Speech difficulty  HPI: Rhonda Fuller is a 74 y.o. female with Past medical history of dyslipidemia, GERD. The patient presents with complaints of speech difficulty when she woke up this morning. She mentions that she was at her baseline last night when she woke up she was having difficulty speaking. The difficulty was described as slurred speech without any expressive aphasia. She was also having difficulty typing on computer this morning when she was typing an email. While walking at house she felt that she was swaying on the left side. Since the symptoms were not improving family brought her to the hospital for further workup. No fall no trauma no injury no fever no chills no chest pain or abdominal pain no palpitation no dizziness no lightheadedness no nausea no vomiting no diarrhea no burning urination no other focal deficit reported.  The patient is coming from home. And at her baseline independent for most of her ADL.  Review of Systems: as mentioned in the history of present illness.  A comprehensive review of the other systems is negative.  Past Medical History  Diagnosis Date  . Hyperlipidemia    History reviewed. No pertinent past surgical history. Social History:  reports that she has never smoked. She does not have any smokeless tobacco history on file. She reports that she does not drink alcohol or use illicit drugs.  Allergies  Allergen Reactions  . Codeine Rash    Big blisters    Family History  Problem Relation Age of Onset  . CVA Mother   . CVA Father     Prior to Admission medications   Medication Sig Start Date End Date Taking? Authorizing Provider  Aspirin-Salicylamide-Caffeine (BC HEADACHE POWDER PO) Take 1 packet by mouth  daily as needed (for headache).   Yes Historical Provider, MD  Cyanocobalamin (B-12 MICROLOZENGE) 500 MCG SUBL Place 1,000 mcg under the tongue daily.   Yes Historical Provider, MD  omeprazole (PRILOSEC) 40 MG capsule Take 40 mg by mouth 2 (two) times daily. 05/15/15  Yes Historical Provider, MD  pravastatin (PRAVACHOL) 40 MG tablet Take 40 mg by mouth at bedtime.  03/17/15  Yes Historical Provider, MD  alendronate (FOSAMAX) 70 MG tablet Take 70 mg by mouth once a week.  05/03/15   Historical Provider, MD    Physical Exam: Filed Vitals:   05/26/15 1930 05/26/15 2051 05/26/15 2105 05/26/15 2300  BP: 163/65 165/74 172/80 151/63  Pulse: 84  82 85  Temp:   99.5 F (37.5 C) 98.2 F (36.8 C)  TempSrc:   Oral Oral  Resp: 15  18 16   Height:   5\' 2"  (1.575 m)   Weight:   62.732 kg (138 lb 4.8 oz)   SpO2: 97%  97% 97%    General: Alert, Awake and Oriented to Time, Place and Person. Appear in mild distress Eyes: PERRL ENT: Oral Mucosa clear moist. Neck: no JVD Cardiovascular: S1 and S2 Present, no Murmur, Peripheral Pulses Present Respiratory: Bilateral Air entry equal and Decreased,  Clear to Auscultation, no Crackles, no wheezes Abdomen: Bowel Sound present, Soft and non tender Skin: no Rash Extremities: no Pedal edema, no calf tenderness Neurologic: Grossly no focal neuro deficit other than slow speech,   Labs on Admission:  CBC:  Recent Labs Lab 05/26/15  1742  WBC 8.4  NEUTROABS 4.9  HGB 14.1  HCT 42.3  MCV 88.1  PLT 324    CMP     Component Value Date/Time   NA 140 05/26/2015 1742   K 3.7 05/26/2015 1742   CL 107 05/26/2015 1742   CO2 24 05/26/2015 1742   GLUCOSE 103* 05/26/2015 1742   BUN 12 05/26/2015 1742   CREATININE 0.72 05/26/2015 1742   CALCIUM 9.6 05/26/2015 1742   PROT 7.7 05/26/2015 1742   ALBUMIN 4.1 05/26/2015 1742   AST 15 05/26/2015 1742   ALT 13* 05/26/2015 1742   ALKPHOS 71 05/26/2015 1742   BILITOT 0.3 05/26/2015 1742   GFRNONAA >60 05/26/2015  1742   GFRAA >60 05/26/2015 1742    No results for input(s): LIPASE, AMYLASE in the last 168 hours.  No results for input(s): CKTOTAL, CKMB, CKMBINDEX, TROPONINI in the last 168 hours. BNP (last 3 results) No results for input(s): BNP in the last 8760 hours.  ProBNP (last 3 results) No results for input(s): PROBNP in the last 8760 hours.   Radiological Exams on Admission: Ct Head Wo Contrast  05/26/2015   CLINICAL DATA:  One day history of left-sided weakness and aphasia  EXAM: CT HEAD WITHOUT CONTRAST  TECHNIQUE: Contiguous axial images were obtained from the base of the skull through the vertex without intravenous contrast.  COMPARISON:  None.  FINDINGS: There is age related volume loss. The left lateral ventricle is slightly larger than the right lateral ventricle, an apparent anatomic variant. There is no intracranial mass, hemorrhage, extra-axial fluid collection, or midline shift. There is decreased attenuation extending from the superior right lentiform nucleus into the inferior right centrum semiovale. This finding is suspicious for an early/acute infarct. Elsewhere, gray-white compartments appear normal. Bony calvarium appears intact. The mastoid air cells are clear.  IMPRESSION: Probable acute/recent infarct extending from the superior right lentiform nucleus into the inferior right centrum semiovale more medially. No hemorrhage or mass effect. Age related volume loss present.   Electronically Signed   By: Bretta Bang III M.D.   On: 05/26/2015 19:05   EKG: Independently reviewed. normal sinus rhythm, nonspecific ST and T waves changes.  Assessment/Plan Principal Problem:   CVA (cerebral infarction) Active Problems:   Hyperlipidemia   GERD (gastroesophageal reflux disease)   1. CVA (cerebral infarction) The patient is presenting with complaints of slurred speech. She was also having difficulty ambulating earlier in the morning. At present her examination reveals some  slow speech but no other significant finding. CT is concerning for acute recent stroke. Neurology is consulted and the patient is started on aspirin. PTOT speech consultation nothing by mouth until stroke evaluation. MRI echocardiogram and Doppler per neurology. Follow on telemetry. Permissive hypertension at present.  2. essential hypertension. With the presentation the patient's blood pressure is currently elevated about 160 over 70s to 90s diastolic. With this the patient may require treatment for hypertension. Once the acute phase of stroke is over the patient should be placed on antihypertensives medication which she has never taken.  3. dyslipidemia. Continue home medication.  4. history of GERD. Continuing PPI.  Advance goals of care discussion: Full code   DVT Prophylaxis: subcutaneous Heparin Nutrition: Nothing by mouth pending stroke evaluation  Family Communication: family was present at bedside, opportunity was given to ask question and all questions were answered satisfactorily at the time of interview. Disposition: Admitted as inpatient, telemetry unit.  Author: Lynden Oxford, MD [facility] Pager: [phone]  If  7PM-7AM, please contact night-coverage [email or web address] Password [email or web address]

## 2015-05-27 NOTE — Progress Notes (Signed)
  Echocardiogram 2D Echocardiogram has been performed.  Arvil Chaco 05/27/2015, 3:21 PM

## 2015-05-27 NOTE — Evaluation (Addendum)
Occupational Therapy Evaluation Patient Details Name: Rhonda Fuller MRN: [id number] DOB: Dec 28, 1940 Today's Date: 05/27/2015    History of Present Illness pt is a 74 y.o. female admitted with c/o slurred speech, swaying to the Left, ?left sided incoordination.  CT showed right lentiform/ insular infarct M2 distribution.   Clinical Impression   Discussed stroke symptoms and signs. Education provided during session and she will have family to assist at home.     Follow Up Recommendations  No OT follow up;Supervision - Intermittent    Equipment Recommendations  None recommended by OT    Recommendations for Other Services       Precautions / Restrictions Precautions Precautions: Fall Restrictions Weight Bearing Restrictions: No      Mobility Bed Mobility Overal bed mobility: Modified Independent                Transfers Overall transfer level: Needs assistance   Transfers: Sit to/from Stand Sit to Stand: Supervision;Min guard         General transfer comment: Min guard for initial stands from bed.     Balance LOB with initial stand from bed, but just sat back on bed; unsteady when threading leg through underwear standing (supervision). pt able to pick something off the floor without LOB.           ADL Overall ADL's : Needs assistance/impaired     Grooming: Standing;Wash/dry hands;Supervision/safety               Lower Body Dressing: Set up;Supervision/safety;Sit to/from stand (donned panties over feet mostly standing)   Toilet Transfer: Supervision/safety;Min guard;Ambulation;Comfort height toilet (Min guard for sit to stand from bed towards beginning and for brief short ambulation then Supervision level)       Tub/ Shower Transfer: Tub transfer;Supervision/safety;Ambulation -able to get down in tub and back out   Functional mobility during ADLs: Supervision/safety/ Min guard (min guard initially-short distance) General ADL Comments: Educated  on safety such as safe footwear, rugs/items on floor, sitting for most of LB ADLs. Recommended someone be with her for tub transfer.     Vision Vision Assessment?: Yes Visual Fields: No apparent deficits   Perception     Praxis      Pertinent Vitals/Pain Pain Assessment: 0-10 Pain Score: 9  Pain Location: IV site Pain Intervention(s):  (notified nurse)   HR up to around 149 after practicing tub transfer-cues for deep breathing technique and took break. Trended down. Nurse notified of increased HR.     Hand Dominance     Extremity/Trunk Assessment Upper Extremity Assessment Upper Extremity Assessment: Overall WFL for tasks assessed LUE Deficits / Details: mild incoordination--finger/thumb approx., elbow flexion   Lower Extremity Assessment Lower Extremity Assessment: Defer to PT evaluation LLE Deficits / Details: mild incoordination seen mostly in gait with foot flat placement       Communication Communication Communication: Other (comment) (pt reports speech feels off-may be mildly slurred)   Cognition Arousal/Alertness: Awake/alert Behavior During Therapy: WFL for tasks assessed/performed Overall Cognitive Status: Within Functional Limits for tasks assessed                     General Comments       Exercises       Shoulder Instructions      Home Living Family/patient expects to be discharged to:: Private residence Living Arrangements: Spouse/significant other Available Help at Discharge: Family;Available 24 hours/day Type of Home: House Home Access: Stairs to enter [employer] of Steps:  2 Entrance Stairs-Rails: Right;Left Home Layout: One level     Bathroom Shower/Tub: Chief Strategy Officer: Standard     Home Equipment: Cane - single point          Prior Functioning/Environment Level of Independence: Independent        Comments: active    OT Diagnosis: Other (comment) (decreased balance/mild left  hemiparesis)   OT Problem List:     OT Treatment/Interventions:      OT Goals(Current goals can be found in the care plan section)   OT Frequency:     Barriers to D/C:            Co-evaluation              End of Session Equipment Utilized During Treatment: Gait belt Nurse Communication: Other (comment) (pain at IV site; HR)  Activity Tolerance: Patient tolerated treatment well Patient left: in chair;with call bell/phone within reach;with family/visitor present   Time: 1550-1609 OT Time Calculation (min): 19 min Charges:  OT General Charges $OT Visit: 1 Procedure OT Evaluation $Initial OT Evaluation Tier I: 1 Procedure G-CodesEarlie Raveling OTR/L [id number] 05/27/2015, 4:25 PM

## 2015-05-27 NOTE — Progress Notes (Signed)
[facility] PROGRESS NOTE  Rhonda Fuller ZOX:[id number] DOB: May 16, 1941 DOA: 05/26/2015 PCP: [name], MD  Assessment/Plan: 1. CVA- MRI brain shows acute ischemic infarct involving the right lentiform nucleus with superior extension into the periventricular white matter of the right corona radiata, carotid Doppler, echo pending. Continue aspirin as antiplatelets therapy. 2. Hypertension- blood pressure is mildly elevated, currently not on any anti-evidence of medications. No antihypertensives for permissive hypertension 3. Dyslipidemia- continue Pravachol  Code Status: Full code Family Communication: *No family at bedside Disposition Plan: To be decided   Consultants:  Neurology  Procedures:  None  Antibiotics:  None  HPI/Subjective: 74 year old female with a history of dyslipidemia, GERD who came with difficulty in speaking, also difficulty walking on left side. Patient was found to have small right hemispheric stroke.  This morning patient denies any weakness of extremities, her dysarthria has resolved.  Objective: Filed Vitals:   05/27/15 0933  BP: 154/75  Pulse: 96  Temp: 98.4 F (36.9 C)  Resp: 16   No intake or output data in the 24 hours ending 05/27/15 1303 Filed Weights   05/26/15 1735 05/26/15 2105  Weight: 63.73 kg (140 lb 8 oz) 62.732 kg (138 lb 4.8 oz)    Exam:   General: Appears in no acute distress  Cardiovascular: S1-S2 is regular  Respiratory: Clear to auscultation bilaterally, no wheezing or crackles auscultated.  Abdomen: Soft, nontender, no organomegaly  Musculoskeletal: No edema of the lower extremities    Neuro- alert, oriented 3, motor strength 5 over 5 in all extremities, cranial nerve II- XII grossly intact  Data Reviewed: Basic Metabolic Panel:  Recent Labs Lab 05/26/15 1742 05/27/15 0547  NA 140 140  K 3.7 3.9  CL 107 106  CO2 24 27  GLUCOSE 103* 112*  BUN 12 12  CREATININE 0.72 0.91  CALCIUM 9.6  9.6   Liver Function Tests:  Recent Labs Lab 05/26/15 1742 05/27/15 0547  AST 15 16  ALT 13* 15  ALKPHOS 71 61  BILITOT 0.3 0.5  PROT 7.7 7.0  ALBUMIN 4.1 3.8   No results for input(s): LIPASE, AMYLASE in the last 168 hours. No results for input(s): AMMONIA in the last 168 hours. CBC:  Recent Labs Lab 05/26/15 1742 05/27/15 0547  WBC 8.4 8.4  NEUTROABS 4.9  --   HGB 14.1 13.5  HCT 42.3 41.1  MCV 88.1 88.2  PLT 324 294    CBG:  Recent Labs Lab 05/26/15 1745  GLUCAP 108*    No results found for this or any previous visit (from the past 240 hour(s)).   Studies: Ct Head Wo Contrast  05/26/2015   CLINICAL DATA:  One day history of left-sided weakness and aphasia  EXAM: CT HEAD WITHOUT CONTRAST  TECHNIQUE: Contiguous axial images were obtained from the base of the skull through the vertex without intravenous contrast.  COMPARISON:  None.  FINDINGS: There is age related volume loss. The left lateral ventricle is slightly larger than the right lateral ventricle, an apparent anatomic variant. There is no intracranial mass, hemorrhage, extra-axial fluid collection, or midline shift. There is decreased attenuation extending from the superior right lentiform nucleus into the inferior right centrum semiovale. This finding is suspicious for an early/acute infarct. Elsewhere, gray-white compartments appear normal. Bony calvarium appears intact. The mastoid air cells are clear.  IMPRESSION: Probable acute/recent infarct extending from the superior right lentiform nucleus into the inferior right centrum semiovale more medially. No hemorrhage or mass effect. Age related volume loss  present.   Electronically Signed   By: Bretta Bang III M.D.   On: 05/26/2015 19:05   [name]  05/27/2015   CLINICAL DATA:  Initial evaluation for acute dysarthria since this morning.  EXAM: MRI HEAD WITHOUT CONTRAST  MRA HEAD WITHOUT CONTRAST  TECHNIQUE: Multiplanar, multiecho pulse sequences  of the brain and surrounding structures were obtained without intravenous contrast. Angiographic images of the head were obtained using MRA technique without contrast.  COMPARISON:  Prior CT from earlier the same day.  FINDINGS: MRI HEAD FINDINGS  Age appropriate cerebral volume loss is present. Patchy and confluent T2/FLAIR hyperintensity within the periventricular and deep white matter present, most compatible with chronic small vessel ischemic changes, mild for patient age.  There is an abnormal focus of restricted diffusion involving the right lentiform nucleus with superior extension into the periventricular white matter of the right corona radiata act, compatible with acute ischemic infarct. This corresponds with previously noted hypodensity as seen on prior CT. No associated hemorrhage or significant mass effect. No other infarct. Gray-white matter differentiation is otherwise maintained. Normal intravascular flow voids preserved. No acute or chronic intracranial hemorrhage.  No mass lesion or midline shift. No hydrocephalus. No extra-axial fluid collection.  Craniocervical junction within normal limits. Reversal of the normal cervical lordosis with multilevel degenerative changes noted within the visualized upper cervical spine.  Pituitary gland normal.  No acute abnormality about the orbits.  Mild polypoid opacity present within the inferior maxillary sinuses bilaterally, slightly greater on the right. Mild mucosal thickening within the ethmoidal air cells. Paranasal sinuses are otherwise clear. Scattered opacity present within the left mastoid air cells without frank effusion. Inner ear structures normal.  Bone marrow signal intensity within normal limits. Scalp soft tissues unremarkable.  MRA HEAD FINDINGS  ANTERIOR CIRCULATION:  Visualized distal cervical segments of the internal carotid arteries are widely patent with antegrade flow. The petrous, cavernous, and supra clinoid segments are widely patent.  A1 segments, anterior communicating artery, and anterior cerebral arteries well opacified. M1 segments demonstrate no high-grade stenosis or occlusion. There is early bifurcation of the left M1 segment. Mild atheromatous irregularity present within the right M1 segment. Distal MCA branches well opacified bilaterally. There is mild asymmetric atheromatous irregularity within the distal right MCA branches as compared to the left.  POSTERIOR CIRCULATION:  Left vertebral artery is dominant and widely patent to the vertebrobasilar junction. The diminutive right vertebral artery patent to the vertebrobasilar junction as well. Posterior inferior cerebral arteries are opacified bilaterally. Basilar artery mildly tortuous but widely patent. Superior cerebellar and posterior cerebral arteries well opacified bilaterally. Fetal origin of the right PCA with widely patent right posterior communicating artery.  No aneurysm or vascular malformation.  IMPRESSION: MRI HEAD IMPRESSION:  1. 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 No associated hemorrhage or significant mass effect. 2. No other acute intracranial process. 3. Age appropriate cerebral atrophy with mild chronic small vessel ischemic disease.  MRA HEAD IMPRESSION:  1. No large vessel occlusion or hemodynamically significant stenosis within the intracranial circulation. 2. Mild atheromatous irregularity within the right M1 segment and distal right MCA branches. 3. Fetal origin of the right PCA.   Electronically Signed   By: Rise Mu M.D.   On: 05/27/2015 01:04   Mr Maxine Glenn Head/brain Wo Cm  05/27/2015   CLINICAL DATA:  Initial evaluation for acute dysarthria since this morning.  EXAM: MRI  HEAD WITHOUT CONTRAST  MRA HEAD WITHOUT CONTRAST  TECHNIQUE: Multiplanar, multiecho pulse sequences of the brain and surrounding structures were obtained without  intravenous contrast. Angiographic images of the head were obtained using MRA technique without contrast.  COMPARISON:  Prior CT from earlier the same day.  FINDINGS: MRI HEAD FINDINGS  Age appropriate cerebral volume loss is present. Patchy and confluent T2/FLAIR hyperintensity within the periventricular and deep white matter present, most compatible with chronic small vessel ischemic changes, mild for patient age.  There is an abnormal focus of restricted diffusion involving the right lentiform nucleus with superior extension into the periventricular white matter of the right corona radiata act, compatible with acute ischemic infarct. This corresponds with previously noted hypodensity as seen on prior CT. No associated hemorrhage or significant mass effect. No other infarct. Gray-white matter differentiation is otherwise maintained. Normal intravascular flow voids preserved. No acute or chronic intracranial hemorrhage.  No mass lesion or midline shift. No hydrocephalus. No extra-axial fluid collection.  Craniocervical junction within normal limits. Reversal of the normal cervical lordosis with multilevel degenerative changes noted within the visualized upper cervical spine.  Pituitary gland normal.  No acute abnormality about the orbits.  Mild polypoid opacity present within the inferior maxillary sinuses bilaterally, slightly greater on the right. Mild mucosal thickening within the ethmoidal air cells. Paranasal sinuses are otherwise clear. Scattered opacity present within the left mastoid air cells without frank effusion. Inner ear structures normal.  Bone marrow signal intensity within normal limits. Scalp soft tissues unremarkable.  MRA HEAD FINDINGS  ANTERIOR CIRCULATION:  Visualized distal cervical segments of the internal carotid arteries are widely patent with antegrade flow. The petrous, cavernous, and supra clinoid segments are widely patent. A1 segments, anterior communicating artery, and anterior  cerebral arteries well opacified. M1 segments demonstrate no high-grade stenosis or occlusion. There is early bifurcation of the left M1 segment. Mild atheromatous irregularity present within the right M1 segment. Distal MCA branches well opacified bilaterally. There is mild asymmetric atheromatous irregularity within the distal right MCA branches as compared to the left.  POSTERIOR CIRCULATION:  Left vertebral artery is dominant and widely patent to the vertebrobasilar junction. The diminutive right vertebral artery patent to the vertebrobasilar junction as well. Posterior inferior cerebral arteries are opacified bilaterally. Basilar artery mildly tortuous but widely patent. Superior cerebellar and posterior cerebral arteries well opacified bilaterally. Fetal origin of the right PCA with widely patent right posterior communicating artery.  No aneurysm or vascular malformation.  IMPRESSION: MRI HEAD IMPRESSION:  1. 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 No associated hemorrhage or significant mass effect. 2. No other acute intracranial process. 3. Age appropriate cerebral atrophy with mild chronic small vessel ischemic disease.  MRA HEAD IMPRESSION:  1. No large vessel occlusion or hemodynamically significant stenosis within the intracranial circulation. 2. Mild atheromatous irregularity within the right M1 segment and distal right MCA branches. 3. Fetal origin of the right PCA.   Electronically Signed   By: Rise Mu M.D.   On: 05/27/2015 01:04    Scheduled Meds: . aspirin  300 mg Rectal Daily   Or  . aspirin  325 mg Oral Daily  . heparin  5,000 Units Subcutaneous 3 times per day  . pantoprazole  40 mg Oral Daily  . pravastatin  80 mg Oral QHS   Continuous Infusions:   Principal Problem:   CVA (cerebral infarction) Active Problems:  Hyperlipidemia   GERD (gastroesophageal  reflux disease)    Time spent: 20 minutes    [facility]  [facility] Pager [phone]*. If 7PM-7AM, please contact night-coverage at [email or web address], password [email or web address] 05/27/2015, 1:03 PM  LOS: 1 day

## 2015-05-28 ENCOUNTER — Ambulatory Visit (HOSPITAL_COMMUNITY): Payer: Medicare Other

## 2015-05-28 DIAGNOSIS — I639 Cerebral infarction, unspecified: Secondary | ICD-10-CM

## 2015-05-28 LAB — HEMOGLOBIN A1C
Hgb A1c MFr Bld: 6.2 % — ABNORMAL HIGH (ref 4.8–5.6)
Mean Plasma Glucose: 131 mg/dL

## 2015-05-28 MED ORDER — ASPIRIN 325 MG PO TABS: 325.0000 mg | ORAL_TABLET | Freq: Every day | ORAL | Status: AC

## 2015-05-28 MED ORDER — PRAVASTATIN SODIUM 80 MG PO TABS: 80.0000 mg | ORAL_TABLET | Freq: Every day | ORAL | Status: DC

## 2015-05-28 NOTE — Progress Notes (Signed)
STROKE TEAM PROGRESS NOTE   HISTORY Rhonda Fuller is a 74 y.o. female who has been expressing dysarthria since she awoke this morning. She also notices that when she walks it feels like she is being pulled to the left. She states that this is metastatic deficits since this morning. She had considered waiting until Monday to see if she got better, but her family convinced her to come in to the ER to seek further attention. She also noticed that while she was typing her left hand was clumsy and she Making mistakes with it.  Here she was found to be severely hypertensive, though she does not take hypertension medication. CT scan showed no hemorrhage and neurology is being consulted for additional recommendations.   LKW: 6/17 prior to bed tpa given?: no, outside of window    SUBJECTIVE (INTERVAL HISTORY No changes.  Overall she feels her condition is rapidly improving.    OBJECTIVE Temp:  [97.9 F (36.6 C)-98.6 F (37 C)] 97.9 F (36.6 C) (06/20 1347) Pulse Rate:  [85-99] 88 (06/20 1347) Cardiac Rhythm:  [-] Normal sinus rhythm (06/19 2100) Resp:  [16-20] 20 (06/20 1347) BP: (130-161)/(64-87) 140/71 mmHg (06/20 1347) SpO2:  [96 %-100 %] 99 % (06/20 1347)   Recent Labs Lab 05/26/15 1745  GLUCAP 108*    Recent Labs Lab 05/26/15 1742 05/27/15 0547  NA 140 140  K 3.7 3.9  CL 107 106  CO2 24 27  GLUCOSE 103* 112*  BUN 12 12  CREATININE 0.72 0.91  CALCIUM 9.6 9.6    Recent Labs Lab 05/26/15 1742 05/27/15 0547  AST 15 16  ALT 13* 15  ALKPHOS 71 61  BILITOT 0.3 0.5  PROT 7.7 7.0  ALBUMIN 4.1 3.8    Recent Labs Lab 05/26/15 1742 05/27/15 0547  WBC 8.4 8.4  NEUTROABS 4.9  --   HGB 14.1 13.5  HCT 42.3 41.1  MCV 88.1 88.2  PLT 324 294   No results for input(s): CKTOTAL, CKMB, CKMBINDEX, TROPONINI in the last 168 hours.  Recent Labs  05/26/15 1742  LABPROT 13.1  INR 0.97   No results for input(s): COLORURINE, LABSPEC, PHURINE, GLUCOSEU, HGBUR,  BILIRUBINUR, KETONESUR, PROTEINUR, UROBILINOGEN, NITRITE, LEUKOCYTESUR in the last 72 hours.  Invalid input(s): APPERANCEUR     Component Value Date/Time   CHOL 186 05/27/2015 0547   TRIG 203* 05/27/2015 0547   HDL 43 05/27/2015 0547   CHOLHDL 4.3 05/27/2015 0547   VLDL 41* 05/27/2015 0547   LDLCALC 102* 05/27/2015 0547   Lab Results  Component Value Date   HGBA1C 6.2* 05/27/2015   No results found for: LABOPIA, COCAINSCRNUR, LABBENZ, AMPHETMU, THCU, LABBARB  No results for input(s): ETH in the last 168 hours.   Imaging   Ct Head Wo Contrast 05/26/2015    Probable acute/recent infarct extending from the superior right lentiform nucleus into the inferior right centrum semiovale more medially. No hemorrhage or mass effect. Age related volume loss present.       [name] 05/27/2015    MRI HEAD IMPRESSION:   1. 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 No associated hemorrhage or significant mass effect.  2. No other acute intracranial process.  3. Age appropriate cerebral atrophy with mild chronic small vessel ischemic disease.    MRA HEAD IMPRESSION:   1. No large vessel occlusion or hemodynamically significant stenosis within the intracranial circulation.  2.  Mild atheromatous irregularity within the right M1 segment and distal right MCA branches.  3. Fetal origin of the right PCA.       PHYSICAL EXAM  GENERAL: Pleasant elderly lady no distress.  HEENT: normal  ABDOMEN: soft  EXTREMITIES: No edema   BACK: normal  SKIN: Normal by inspection.    MENTAL STATUS: Alert and oriented. Speech -mild dysarthria, language and cognition are generally intact. Judgment and insight normal.   CRANIAL NERVES: Pupils are equal, round and reactive to light and accomodation; extra ocular movements are full, there is no significant nystagmus; visual fields are  full; upper and lower facial muscles are normal in strength and symmetric, there is no flattening of the nasolabial folds; tongue is midline; uvula is midline; shoulder elevation is normal.  MOTOR: Normal tone, bulk and strength; no pronator drift. Diminished fine finger movements on the left. Mild left grip weakness. Orbits right over left approximately. Mild weakness of left hip flexors and ankle dorsiflexors only.  COORDINATION: Left finger to nose is normal, right finger to nose is normal, No rest tremor; no intention tremor; no postural tremor; no bradykinesia. Subtle impairment of fine finger movements and finger tapping.   SENSATION: Normal to light touch and temperature.  GAIT : deferred      ASSESSMENT/PLAN [name] is a 74 y.o. female with history of hyperlipidemia and hypertension presenting with dysarthria and clumsy left hand.  She did not receive IV t-PA due to late presentation.  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  Resultant  Clumsy hand dysarthria.   MRI  Acute ischemic infarct involving the right lentiform nucleus with superior extension  MRA  no large vessel occlusion or significant stenosis.  Carotid Doppler 1-39% bilateral carotid stenosis  2D Echo Left ventricle: The cavity size was normal. Wall thickness was normal. Systolic function was normal. The estimated ejection fraction was in the range of 60% to 65%. Wall motion was normal; there were no regional wall motion abnormalities  LDL 102  HgbA1c 6.2  Subcutaneous heparin for VTE prophylaxis Diet Heart Room service appropriate?: Yes; Fluid consistency:: Thin  no antithrombotic prior to admission, now on aspirin 325 mg orally every day  Patient counseled to be compliant with her antithrombotic medications  Ongoing aggressive stroke risk factor management  Therapy recommendations: outpt PT  Disposition:  Home  Hypertension  Home meds: No  antihypertensives medications prior to admission.  Stable   Hyperlipidemia  Home meds:  Pravachol 40 mg daily resumed in hospital  LDL 102 goal < 70  Increase Pravachol to 80 mg daily  Continue statin at discharge   Other Stroke Risk Factors  Advanced age  Family hx stroke (Both parents had strokes)   Other Active Problems    Other Pertinent History    Hospital day # 2  I had a long discussion with the patient and family regarding her stroke and recommend aspirin for stroke prevention and strict control of lipids with LDL goal below 70 mg percent and hypertension with blood pressure goal below 130/90. Follow-up as an outpatient in 2 months. Stroke service will sign off. Kindly call for questions. Delia Heady, MD   To contact Stroke Continuity provider, please refer to [email or web address]. After hours, contact General Neurology

## 2015-05-28 NOTE — Care Management Note (Signed)
Case Management Note  Patient Details  Name: SHELSEY RIETH MRN: [id number] Date of Birth: Apr 07, 1941  Subjective/Objective:                    Action/Plan: Met with patient to discuss outpatient PT. Patient has requested [facility] in [place].  Requested paperwork was faxed and patient was scheduled for 05/31/15 at 3pm.  Information was added to the AVS. Bedside RN updated.  Expected Discharge Date:                  Expected Discharge Plan:  Home/Self Care  In-House Referral:     Discharge planning Services  CM Consult  Post Acute Care Choice:    Choice offered to:     DME Arranged:    DME Agency:     HH Arranged:    [place] Agency:     Status of Service:  Completed, signed off  Medicare Important Message Given:    Date Medicare IM Given:    Medicare IM give by:    Date Additional Medicare IM Given:    Additional Medicare Important Message give by:     If discussed at [place] of Stay Meetings, dates discussed:    Additional Comments:  Rolm Baptise, RN 05/28/2015, 5:01 PM

## 2015-05-28 NOTE — Progress Notes (Signed)
Patient ready for discharge home; discharge instructions given and reviewed; son accompanied patient home; discharged out via wheelchair.

## 2015-05-28 NOTE — Progress Notes (Signed)
Utilization Review Completed.Rhonda Fuller T6/20/2016  

## 2015-05-28 NOTE — Progress Notes (Signed)
VASCULAR LAB PRELIMINARY  PRELIMINARY  PRELIMINARY  PRELIMINARY  Carotid duplex completed.    Preliminary report:  Bilateral:  1-39% ICA stenosis.  Vertebral artery flow is antegrade.     Aira Sallade, RVS 05/28/2015, 2:31 PM

## 2015-05-28 NOTE — Discharge Summary (Addendum)
Physician Discharge Summary  Rhonda Fuller WUJ:[id number] DOB: [birth year] DOA: 05/26/2015  PCP: [name], MD  Admit date: 05/26/2015 Discharge date: 05/28/2015  Time spent: 30  minutes  Recommendations for Outpatient Follow-up:  1. Follow up PCP in 2 weeks 2. Follow up neurology in 2 months 3.  Discharge Diagnoses:  Principal Problem:   CVA (cerebral infarction) Active Problems:   Hyperlipidemia   GERD (gastroesophageal reflux disease)   Cerebral infarction due to unspecified mechanism   Discharge Condition: Stable  Diet recommendation: Low salt diet  Filed Weights   05/26/15 1735 05/26/15 2105  Weight: 63.73 kg (140 lb 8 oz) 62.732 kg (138 lb 4.8 oz)    History of present illness:  74 year old female with a history of dyslipidemia, GERD who came with difficulty in speaking, also difficulty walking on left side. Patient was found to have small right hemispheric stroke  Hospital Course:  1. CVA- MRI brain shows acute ischemic infarct involving the right lentiform nucleus with superior extension into the periventricular white matter of the right corona radiata, carotid Doppler Bilateral: 1-39% ICA stenosis , echo negative for thrombus or PFO. Continue aspirin full dose as per neurology recommendation. Will order outpatient PT 2. Hypertension- blood pressure is mildly elevated, currently not on any anti-hypertensive meds 3. Dyslipidemia- continue Pravachol 80 mg po daily  Procedures:  Echo  Carotid doppler  Consultations:  Neurology  Discharge Exam: Filed Vitals:   05/28/15 1347  BP: 140/71  Pulse: 88  Temp: 97.9 F (36.6 C)  Resp: 20    General: Appear in no acute distress Cardiovascular: S1S2 RRR Respiratory: Clear bilaterally  Discharge Instructions   Discharge Instructions    Ambulatory referral to Neurology    Complete by:  As directed   Please schedule post stroke follow up in 2 months.     Diet - low sodium heart healthy    Complete by:   As directed      Increase activity slowly    Complete by:  As directed           Current Discharge Medication List    START taking these medications   Details  aspirin 325 MG tablet Take 1 tablet (325 mg total) by mouth daily. Qty: 30 tablet, Refills: 2      CONTINUE these medications which have CHANGED   Details  pravastatin (PRAVACHOL) 80 MG tablet Take 1 tablet (80 mg total) by mouth at bedtime. Qty: 30 tablet, Refills: 2      CONTINUE these medications which have NOT CHANGED   Details  Cyanocobalamin (B-12 MICROLOZENGE) 500 MCG SUBL Place 1,000 mcg under the tongue daily.    omeprazole (PRILOSEC) 40 MG capsule Take 40 mg by mouth 2 (two) times daily.    alendronate (FOSAMAX) 70 MG tablet Take 70 mg by mouth once a week.       STOP taking these medications     Aspirin-Salicylamide-Caffeine (BC HEADACHE POWDER PO)        Allergies  Allergen Reactions  . Codeine Rash    Big blisters   Follow-up Information    Follow up with [name],[name], MD In 2 months.   Specialties:  Neurology, Radiology   Why:  Stroke Clinic, Office will call you with appointment date & time   Contact information:   [address] [place] [address] [phone]       Follow up with [name],[name], MD In 2 months.   Specialties:  Neurology, Radiology   Why:  office will call you for follow up appointment   Contact information:   [address] [place] [address] [phone]        The results of significant diagnostics from this hospitalization (including imaging, microbiology, ancillary and laboratory) are listed below for reference.    Significant Diagnostic Studies: Ct Head Wo Contrast  05/26/2015   CLINICAL DATA:  One day history of left-sided weakness and aphasia  EXAM: CT HEAD WITHOUT CONTRAST  TECHNIQUE: Contiguous axial images were obtained from the base of the skull through the vertex without intravenous contrast.  COMPARISON:  None.   FINDINGS: There is age related volume loss. The left lateral ventricle is slightly larger than the right lateral ventricle, an apparent anatomic variant. There is no intracranial mass, hemorrhage, extra-axial fluid collection, or midline shift. There is decreased attenuation extending from the superior right lentiform nucleus into the inferior right centrum semiovale. This finding is suspicious for an early/acute infarct. Elsewhere, gray-white compartments appear normal. Bony calvarium appears intact. The mastoid air cells are clear.  IMPRESSION: Probable acute/recent infarct extending from the superior right lentiform nucleus into the inferior right centrum semiovale more medially. No hemorrhage or mass effect. Age related volume loss present.   Electronically Signed   By: Bretta Bang III M.D.   On: 05/26/2015 19:05   [name]  05/27/2015   CLINICAL DATA:  Initial evaluation for acute dysarthria since this morning.  EXAM: MRI HEAD WITHOUT CONTRAST  MRA HEAD WITHOUT CONTRAST  TECHNIQUE: Multiplanar, multiecho pulse sequences of the brain and surrounding structures were obtained without intravenous contrast. Angiographic images of the head were obtained using MRA technique without contrast.  COMPARISON:  Prior CT from earlier the same day.  FINDINGS: MRI HEAD FINDINGS  Age appropriate cerebral volume loss is present. Patchy and confluent T2/FLAIR hyperintensity within the periventricular and deep white matter present, most compatible with chronic small vessel ischemic changes, mild for patient age.  There is an abnormal focus of restricted diffusion involving the right lentiform nucleus with superior extension into the periventricular white matter of the right corona radiata act, compatible with acute ischemic infarct. This corresponds with previously noted hypodensity as seen on prior CT. No associated hemorrhage or significant mass effect. No other infarct. Gray-white matter differentiation is  otherwise maintained. Normal intravascular flow voids preserved. No acute or chronic intracranial hemorrhage.  No mass lesion or midline shift. No hydrocephalus. No extra-axial fluid collection.  Craniocervical junction within normal limits. Reversal of the normal cervical lordosis with multilevel degenerative changes noted within the visualized upper cervical spine.  Pituitary gland normal.  No acute abnormality about the orbits.  Mild polypoid opacity present within the inferior maxillary sinuses bilaterally, slightly greater on the right. Mild mucosal thickening within the ethmoidal air cells. Paranasal sinuses are otherwise clear. Scattered opacity present within the left mastoid air cells without frank effusion. Inner ear structures normal.  Bone marrow signal intensity within normal limits. Scalp soft tissues unremarkable.  MRA HEAD FINDINGS  ANTERIOR CIRCULATION:  Visualized distal cervical segments of the internal carotid arteries are widely patent with antegrade flow. The petrous, cavernous, and supra clinoid segments are widely patent. A1 segments, anterior communicating artery, and anterior cerebral arteries well opacified. M1 segments demonstrate no high-grade stenosis or occlusion. There is early bifurcation of the left M1 segment. Mild atheromatous irregularity present within the right M1 segment. Distal MCA branches well opacified bilaterally. There is mild asymmetric atheromatous irregularity within the distal right MCA branches  as compared to the left.  POSTERIOR CIRCULATION:  Left vertebral artery is dominant and widely patent to the vertebrobasilar junction. The diminutive right vertebral artery patent to the vertebrobasilar junction as well. Posterior inferior cerebral arteries are opacified bilaterally. Basilar artery mildly tortuous but widely patent. Superior cerebellar and posterior cerebral arteries well opacified bilaterally. Fetal origin of the right PCA with widely patent right posterior  communicating artery.  No aneurysm or vascular malformation.  IMPRESSION: MRI HEAD IMPRESSION:  1. 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 No associated hemorrhage or significant mass effect. 2. No other acute intracranial process. 3. Age appropriate cerebral atrophy with mild chronic small vessel ischemic disease.  MRA HEAD IMPRESSION:  1. No large vessel occlusion or hemodynamically significant stenosis within the intracranial circulation. 2. Mild atheromatous irregularity within the right M1 segment and distal right MCA branches. 3. Fetal origin of the right PCA.   Electronically Signed   By: Rise Mu M.D.   On: 05/27/2015 01:04   Mr Maxine Glenn Head/brain Wo Cm  05/27/2015   CLINICAL DATA:  Initial evaluation for acute dysarthria since this morning.  EXAM: MRI HEAD WITHOUT CONTRAST  MRA HEAD WITHOUT CONTRAST  TECHNIQUE: Multiplanar, multiecho pulse sequences of the brain and surrounding structures were obtained without intravenous contrast. Angiographic images of the head were obtained using MRA technique without contrast.  COMPARISON:  Prior CT from earlier the same day.  FINDINGS: MRI HEAD FINDINGS  Age appropriate cerebral volume loss is present. Patchy and confluent T2/FLAIR hyperintensity within the periventricular and deep white matter present, most compatible with chronic small vessel ischemic changes, mild for patient age.  There is an abnormal focus of restricted diffusion involving the right lentiform nucleus with superior extension into the periventricular white matter of the right corona radiata act, compatible with acute ischemic infarct. This corresponds with previously noted hypodensity as seen on prior CT. No associated hemorrhage or significant mass effect. No other infarct. Gray-white matter differentiation is otherwise maintained. Normal intravascular flow voids preserved. No  acute or chronic intracranial hemorrhage.  No mass lesion or midline shift. No hydrocephalus. No extra-axial fluid collection.  Craniocervical junction within normal limits. Reversal of the normal cervical lordosis with multilevel degenerative changes noted within the visualized upper cervical spine.  Pituitary gland normal.  No acute abnormality about the orbits.  Mild polypoid opacity present within the inferior maxillary sinuses bilaterally, slightly greater on the right. Mild mucosal thickening within the ethmoidal air cells. Paranasal sinuses are otherwise clear. Scattered opacity present within the left mastoid air cells without frank effusion. Inner ear structures normal.  Bone marrow signal intensity within normal limits. Scalp soft tissues unremarkable.  MRA HEAD FINDINGS  ANTERIOR CIRCULATION:  Visualized distal cervical segments of the internal carotid arteries are widely patent with antegrade flow. The petrous, cavernous, and supra clinoid segments are widely patent. A1 segments, anterior communicating artery, and anterior cerebral arteries well opacified. M1 segments demonstrate no high-grade stenosis or occlusion. There is early bifurcation of the left M1 segment. Mild atheromatous irregularity present within the right M1 segment. Distal MCA branches well opacified bilaterally. There is mild asymmetric atheromatous irregularity within the distal right MCA branches as compared to the left.  POSTERIOR CIRCULATION:  Left vertebral artery is dominant and widely patent to the vertebrobasilar junction. The diminutive right vertebral artery patent to the vertebrobasilar junction as well. Posterior inferior cerebral arteries are opacified bilaterally. Basilar artery  mildly tortuous but widely patent. Superior cerebellar and posterior cerebral arteries well opacified bilaterally. Fetal origin of the right PCA with widely patent right posterior communicating artery.  No aneurysm or vascular malformation.   IMPRESSION: MRI HEAD IMPRESSION:  1. 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 No associated hemorrhage or significant mass effect. 2. No other acute intracranial process. 3. Age appropriate cerebral atrophy with mild chronic small vessel ischemic disease.  MRA HEAD IMPRESSION:  1. No large vessel occlusion or hemodynamically significant stenosis within the intracranial circulation. 2. Mild atheromatous irregularity within the right M1 segment and distal right MCA branches. 3. Fetal origin of the right PCA.   Electronically Signed   By: Rise Mu M.D.   On: 05/27/2015 01:04    Microbiology: No results found for this or any previous visit (from the past 240 hour(s)).   Labs: Basic Metabolic Panel:  Recent Labs Lab 05/26/15 1742 05/27/15 0547  NA 140 140  K 3.7 3.9  CL 107 106  CO2 24 27  GLUCOSE 103* 112*  BUN 12 12  CREATININE 0.72 0.91  CALCIUM 9.6 9.6   Liver Function Tests:  Recent Labs Lab 05/26/15 1742 05/27/15 0547  AST 15 16  ALT 13* 15  ALKPHOS 71 61  BILITOT 0.3 0.5  PROT 7.7 7.0  ALBUMIN 4.1 3.8   No results for input(s): LIPASE, AMYLASE in the last 168 hours. No results for input(s): AMMONIA in the last 168 hours. CBC:  Recent Labs Lab 05/26/15 1742 05/27/15 0547  WBC 8.4 8.4  NEUTROABS 4.9  --   HGB 14.1 13.5  HCT 42.3 41.1  MCV 88.1 88.2  PLT 324 294    CBG:  Recent Labs Lab 05/26/15 1745  GLUCAP 108*       Signed:  Renay Crammer S  [facility] 05/28/2015, 3:25 PM

## 2015-05-28 NOTE — Progress Notes (Signed)
Physical Therapy Treatment Patient Details Name: Rhonda Fuller MRN: [id number] DOB: May 11, 1941 Today's Date: 05/28/2015    History of Present Illness pt is a 74 y.o. female admitted with c/o slurred speech, swaying to the Left, ?left sided incoordination.  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    PT Comments    Pt con't to demo L LE ataxia with decreased step height and length. Pt with increased falls risk as indicated by score of 19 on DGI. Pt to benefit from outpt PT to address L LE deficits to improve balance and decrease falls risk.   Follow Up Recommendations  Outpatient PT;Supervision - Intermittent     Equipment Recommendations  None recommended by PT    Recommendations for Other Services       Precautions / Restrictions Precautions Precautions: Fall Restrictions Weight Bearing Restrictions: No    Mobility  Bed Mobility Overal bed mobility: Modified Independent             General bed mobility comments: increased time, used log roll technique  Transfers Overall transfer level: Needs assistance Equipment used: None Transfers: Sit to/from Stand Sit to Stand: Supervision         General transfer comment: increased time but safe, guarded  Ambulation/Gait Ambulation/Gait assistance: Supervision Ambulation Distance (Feet): 680 Feet Assistive device: None Gait Pattern/deviations: Step-through pattern;Decreased stride length;Narrow base of support Gait velocity: wfl   General Gait Details: Pt with noted decreased L step height and length. when patient asked if she felt like she was walking back to normal she reports "I feel like my step is off." Pt with L circumduction swring phase. no episodes of LOB, mildy unsteady   Stairs Stairs: Yes Stairs assistance: Supervision Stair Management: One rail Right Number of Stairs: 12 General stair comments: able to ascend alternating but descended with step to  Wheelchair Mobility    Modified  Rankin (Stroke Patients Only) Modified Rankin (Stroke Patients Only) Pre-Morbid Rankin Score: No symptoms Modified Rankin: Slight disability     Balance                                    Cognition Arousal/Alertness: Awake/alert Behavior During Therapy: WFL for tasks assessed/performed Overall Cognitive Status: Within Functional Limits for tasks assessed                      Exercises      General Comments        Pertinent Vitals/Pain Pain Assessment: No/denies pain    Home Living                      Prior Function            PT Goals (current goals can now be found in the care plan section) Acute Rehab PT Goals Patient Stated Goal: independence Progress towards PT goals: Progressing toward goals    Frequency  Min 3X/week    PT Plan Discharge plan needs to be updated    Co-evaluation             End of Session Equipment Utilized During Treatment: Gait belt Activity Tolerance: Patient tolerated treatment well Patient left: in bed;with call bell/phone within reach;with bed alarm set     Time: 4098-1191 PT Time Calculation (min) (ACUTE ONLY): 15 min  Charges:  $Gait Training: 8-22 mins  G CodesMarcene Brawn 05/28/2015, 11:23 AM   Lewis Shock, PT, DPT Pager #: [phone] Office #: [phone]

## 2015-06-18 ENCOUNTER — Other Ambulatory Visit: Payer: Self-pay | Admitting: Family Medicine

## 2015-06-18 DIAGNOSIS — Z1231 Encounter for screening mammogram for malignant neoplasm of breast: Secondary | ICD-10-CM

## 2015-06-18 DIAGNOSIS — E2839 Other primary ovarian failure: Secondary | ICD-10-CM

## 2015-07-20 ENCOUNTER — Ambulatory Visit
Admission: RE | Admit: 2015-07-20 | Discharge: 2015-07-20 | Disposition: A | Discharge status: Home / Self Care | Payer: Medicare Other | Source: Ambulatory Visit | Attending: Family Medicine | Admitting: Family Medicine

## 2015-07-20 DIAGNOSIS — E2839 Other primary ovarian failure: Secondary | ICD-10-CM

## 2015-07-20 DIAGNOSIS — Z1231 Encounter for screening mammogram for malignant neoplasm of breast: Secondary | ICD-10-CM

## 2015-07-30 ENCOUNTER — Telehealth: Payer: Self-pay | Admitting: Neurology

## 2015-07-30 ENCOUNTER — Encounter: Payer: Self-pay | Admitting: Neurology

## 2015-07-30 ENCOUNTER — Ambulatory Visit (INDEPENDENT_AMBULATORY_CARE_PROVIDER_SITE_OTHER): Payer: Medicare Other | Admitting: Neurology

## 2015-07-30 VITALS — BP 150/70 | HR 85 | Ht 61.0 in | Wt 138.2 lb

## 2015-07-30 DIAGNOSIS — I10 Essential (primary) hypertension: Secondary | ICD-10-CM

## 2015-07-30 DIAGNOSIS — E785 Hyperlipidemia, unspecified: Secondary | ICD-10-CM | POA: Diagnosis not present

## 2015-07-30 DIAGNOSIS — F32A Depression, unspecified: Secondary | ICD-10-CM

## 2015-07-30 DIAGNOSIS — I63311 Cerebral infarction due to thrombosis of right middle cerebral artery: Secondary | ICD-10-CM | POA: Diagnosis not present

## 2015-07-30 DIAGNOSIS — F329 Major depressive disorder, single episode, unspecified: Secondary | ICD-10-CM

## 2015-07-30 NOTE — Patient Instructions (Addendum)
-   continue ASA and pravastatin for stroke prevention - check BP at home daily and record and call PCP if need BP meds adjustment - OK to get hair done - No restriction for driving, but recommend to drive with family members on board for 2-3 times initially. If both parties feel comfortable of your driving, you can drive alone after. However, you are recommended to drive during the day not at night, no long distance and drive in familiar roads. - Follow up with your primary care physician for stroke risk factor modification. Recommend maintain blood pressure goal <130/80, diabetes with hemoglobin A1c goal below 6.5% and lipids with LDL cholesterol goal below 70 mg/dL.  - follow up in 3 months.

## 2015-07-30 NOTE — Telephone Encounter (Signed)
Pt called and would like to have Dr. Jeannetta Nap to have all notes from todays appt. May call pt at [phone]

## 2015-07-30 NOTE — Progress Notes (Signed)
STROKE NEUROLOGY FOLLOW UP NOTE  NAME: Rhonda Fuller DOB: 1941/09/06  REASON FOR VISIT: stroke follow up HISTORY FROM: pt and chart  Today we had the pleasure of seeing Rhonda Fuller in follow-up at our Neurology Clinic. Pt was accompanied by no one.   History Summary Rhonda Fuller is a 74 y.o. female with history of hyperlipidemia and hypertension was admitted on 05/26/15 for dysarthria and clumsy left hand. 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 Stroke work up including MRA, CUS, 2D echo and A1C unrevealing. LDL 102, increased pravastatin from 40mg  to 80mg . Pt was discharged in good condition with ASA and pravastatin and outpt PT.   Interval History During the interval time, the patient has been doing well. Dysarthria and clumsy left hand resolved. No recurrent stroke like symptoms. However, she was having depressed mood whenever she thinks about her stroke and she has sister living in [place] due to recurrent strokes. She cries frequently at home and also in tears in clinic.  BP 150/70 in clinic.  REVIEW OF SYSTEMS: Full 14 system review of systems performed and notable only for those listed below and in HPI above, all others are negative:  Constitutional:   Cardiovascular:  Ear/Nose/Throat:   Skin:  Eyes:   Respiratory:   Gastroitestinal:   Genitourinary:  Hematology/Lymphatic:   Endocrine:  Musculoskeletal:   Allergy/Immunology:   Neurological:   Psychiatric: not enough sleep Sleep:   The following represents the patient's updated allergies and side effects list: Allergies  Allergen Reactions  . Codeine Rash    Big blisters    The neurologically relevant items on the patient's problem list were reviewed on today's visit.  Neurologic Examination  A problem focused neurological exam (12 or more points of the single system neurologic examination, vital signs counts as 1 point, cranial nerves count for 8 points) was performed.  Blood pressure  150/70, pulse 85, height 5\' 1"  (1.549 m), weight 138 lb 3.2 oz (62.687 kg).  General - Well nourished, well developed, in no apparent distress, mildly depressed mood with occasionally crying.  Ophthalmologic - Sharp disc margins OU.  Cardiovascular - Regular rate and rhythm with no murmur.  Mental Status -  Level of arousal and orientation to time, place, and person were intact. Language including expression, naming, repetition, comprehension was assessed and found intact. Fund of Knowledge was assessed and was intact.  Cranial Nerves II - XII - II - Visual field intact OU. III, IV, VI - Extraocular movements intact. V - Facial sensation intact bilaterally. VII - Facial movement intact bilaterally. VIII - Hearing & vestibular intact bilaterally. X - Palate elevates symmetrically. XI - Chin turning & shoulder shrug intact bilaterally. XII - Tongue protrusion intact.  Motor Strength - The patient's strength was normal in all extremities and pronator drift was absent.  Bulk was normal and fasciculations were absent.   Motor Tone - Muscle tone was assessed at the neck and appendages and was normal.  Reflexes - The patient's reflexes were 1+ in all extremities and she had no pathological reflexes.  Sensory - Light touch, temperature/pinprick were assessed and were normal.    Coordination - The patient had normal movements in the hands and feet with no ataxia or dysmetria.  Tremor was absent.  Gait and Station - The patient's transfers, posture, gait, station, and turns were observed as normal.  Data reviewed: I personally reviewed the images and agree with the radiology interpretations.  Ct  Head Wo Contrast 05/26/2015  Probable acute/recent infarct extending from the superior right lentiform nucleus into the inferior right centrum semiovale more medially. No hemorrhage or mass effect. Age related volume loss present.   [name] 05/27/2015  MRI HEAD IMPRESSION:   1. 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 No associated hemorrhage or significant mass effect.  2. No other acute intracranial process.  3. Age appropriate cerebral atrophy with mild chronic small vessel ischemic disease.  MRA HEAD IMPRESSION:  1. No large vessel occlusion or hemodynamically significant stenosis within the intracranial circulation.  2. Mild atheromatous irregularity within the right M1 segment and distal right MCA branches.  3. Fetal origin of the right PCA.   CUS - Bilateral: 1-39% ICA stenosis. Vertebral artery flow is antegrade.  2D echo - Left ventricle: The cavity size was normal. Wall thickness was normal. Systolic function was normal. The estimated ejection fraction was in the range of 60% to 65%. Wall motion was normal; there were no regional wall motion abnormalities  Component     Latest Ref Rng 05/27/2015  Cholesterol     0 - 200 mg/dL 161  Triglycerides     <150 mg/dL 096 (H)  HDL Cholesterol     >40 mg/dL 43  Total CHOL/HDL Ratio      4.3  VLDL     0 - 40 mg/dL 41 (H)  LDL (calc)     0 - 99 mg/dL 045 (H)  Hemoglobin W0J     4.8 - 5.6 % 6.2 (H)  Mean Plasma Glucose      131    Assessment: As you may recall, she is a 74 y.o. Caucasian female with PMH of hyperlipidemia and hypertension was admitted on 05/26/15 for dysarthria and clumsy left hand. 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 Stroke work up including MRA, CUS, 2D echo and A1C unrevealing. LDL 102, increased pravastatin from 40mg  to 80mg . Pt was discharged in good condition with ASA and pravastatin. She has been doing well after discharge, however developed depressed mood and occasionally crying. She is requesting medication for depression.  Plan:  - continue ASA and pravastatin for stroke prevention - check BP at home daily and record and  call PCP if need BP meds adjustment - No restriction for driving, but recommend to drive with family members on board for 2-3 times initially. If both parties feel comfortable of your driving, you can drive alone after. However, you are recommended to drive during the day not at night, no long distance and drive in familiar roads. - Follow up with your primary care physician for stroke risk factor modification. Recommend maintain blood pressure goal <130/80, diabetes with hemoglobin A1c goal below 6.5% and lipids with LDL cholesterol goal below 70 mg/dL.  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 - RTC in 3 months.  No orders of the defined types were placed in this encounter.    Meds ordered this encounter  Medications  . citalopram (CELEXA) 10 MG tablet    Sig: Take 1 tablet (10 mg total) by mouth daily.    Dispense:  30 tablet    Refill:  2    Patient Instructions  - continue ASA and pravastatin for stroke prevention - check BP at home daily and record and call PCP if need BP meds adjustment - OK to get hair done -  No restriction for driving, but recommend to drive with family members on board for 2-3 times initially. If both parties feel comfortable of your driving, you can drive alone after. However, you are recommended to drive during the day not at night, no long distance and drive in familiar roads. - Follow up with your primary care physician for stroke risk factor modification. Recommend maintain blood pressure goal <130/80, diabetes with hemoglobin A1c goal below 6.5% and lipids with LDL cholesterol goal below 70 mg/dL.  - follow up in 3 months.    Marvel Plan, MD PhD [facility] [address] [place], [place] [address] [phone]

## 2015-07-30 NOTE — Telephone Encounter (Signed)
Just a reminder to send Rx over to [facility] Address: [address] [place], [place], [place] [address] Phone: [phone]

## 2015-07-31 DIAGNOSIS — F329 Major depressive disorder, single episode, unspecified: Secondary | ICD-10-CM | POA: Insufficient documentation

## 2015-07-31 DIAGNOSIS — F32A Depression, unspecified: Secondary | ICD-10-CM | POA: Insufficient documentation

## 2015-07-31 MED ORDER — CITALOPRAM HYDROBROMIDE 10 MG PO TABS: 10.0000 mg | ORAL_TABLET | Freq: Every day | ORAL | Status: DC

## 2015-07-31 NOTE — Telephone Encounter (Signed)
Rn talk to patient about her request. Patient requested her progress notes from her office visit on 07-30-15 to fax to Dr.Wilson Jeannetta Nap at [phone]. Number for PCP is [phone]. Request was fax.

## 2015-08-01 DIAGNOSIS — I10 Essential (primary) hypertension: Secondary | ICD-10-CM | POA: Insufficient documentation

## 2015-08-01 NOTE — Telephone Encounter (Signed)
I have sent via the routing option in epic. Is that enough?  Marvel Plan, MD PhD Stroke Neurology 08/01/2015 1:26 PM

## 2015-08-02 NOTE — Telephone Encounter (Signed)
Patients office notes were fax to her primary see note.

## 2015-11-06 ENCOUNTER — Encounter: Payer: Self-pay | Admitting: Neurology

## 2015-11-06 ENCOUNTER — Ambulatory Visit (INDEPENDENT_AMBULATORY_CARE_PROVIDER_SITE_OTHER): Payer: Medicare Other | Admitting: Neurology

## 2015-11-06 VITALS — BP 157/84 | HR 77 | Ht 61.0 in | Wt 134.6 lb

## 2015-11-06 DIAGNOSIS — I10 Essential (primary) hypertension: Secondary | ICD-10-CM | POA: Diagnosis not present

## 2015-11-06 DIAGNOSIS — I63311 Cerebral infarction due to thrombosis of right middle cerebral artery: Secondary | ICD-10-CM

## 2015-11-06 DIAGNOSIS — E785 Hyperlipidemia, unspecified: Secondary | ICD-10-CM | POA: Diagnosis not present

## 2015-11-06 DIAGNOSIS — F329 Major depressive disorder, single episode, unspecified: Secondary | ICD-10-CM

## 2015-11-06 DIAGNOSIS — F32A Depression, unspecified: Secondary | ICD-10-CM

## 2015-11-06 MED ORDER — CITALOPRAM HYDROBROMIDE 10 MG PO TABS: 10.0000 mg | ORAL_TABLET | Freq: Every day | ORAL | Status: DC

## 2015-11-06 NOTE — Patient Instructions (Addendum)
-   continue ASA and pravastatin for stroke prevention - check BP at home daily - Follow up with your primary care physician for stroke risk factor modification. Recommend maintain blood pressure goal <130/80, diabetes with hemoglobin A1c goal below 6.5% and lipids with LDL cholesterol goal below 70 mg/dL.  - if cholesterol controlled well, may consider decrease pravastatin to 40mg  for a try and to see if your tiredness getting better. - recommend to continue Celexa 10 mg in the morning daily for post stroke depression. If not tolerating well, can try 5mg  daily. - more relaxation and cope with stress - follow up in 6 months.

## 2015-11-06 NOTE — Progress Notes (Signed)
STROKE NEUROLOGY FOLLOW UP NOTE  NAME: Rhonda Fuller DOB: 04/27/1941  REASON FOR VISIT: stroke follow up HISTORY FROM: pt and chart  Today we had Rhonda pleasure of seeing Rhonda Fuller in follow-up at our Neurology Clinic. Pt was accompanied by no one.   History Summary Rhonda Fuller is a 74 y.o. female with history of hyperlipidemia and hypertension was admitted on 05/26/15 for dysarthria and clumsy left hand. 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 Stroke work up including MRA, CUS, 2D echo and A1C unrevealing. LDL 102, increased pravastatin from 40mg  to 80mg . Pt was discharged in good condition with ASA and pravastatin and outpt PT.   07/30/15 follow up - Rhonda Fuller has been doing well. Dysarthria and clumsy left hand resolved. No recurrent stroke like symptoms. However, Rhonda Fuller was having depressed mood whenever Rhonda Fuller thinks about her stroke and Rhonda Fuller has sister living in [place] due to recurrent strokes. Rhonda Fuller cries frequently at home and also in tears in clinic.  BP 150/70 in clinic.  Interval History During Rhonda interval time, pt has been doing well. No recurrent stroke symptoms. However, Rhonda Fuller only took celexa one time in Rhonda afternoon and caused her insomnia, so Rhonda Fuller did not take it further. Rhonda Fuller still has some depressed mood and worrying about her sister who is living in [place] now and had 4 falls in Rhonda last month and nonverbal. Rhonda Fuller still feels tired most of Rhonda time. Rhonda Fuller is going to see her PCP next week. Her BP at home 120-130s but today in clinic 152/84.   REVIEW OF SYSTEMS: Full 14 system review of systems performed and notable only for those listed below and in HPI above, all others are negative:  Constitutional:   Cardiovascular:  Ear/Nose/Throat:   Skin:  Eyes:   Respiratory:   Gastroitestinal:   Genitourinary:  Hematology/Lymphatic:   Endocrine: flushing Musculoskeletal:   Allergy/Immunology:   Neurological:  Dizziness, HA, weakness Psychiatric:  Sleep: frequent  waking  Rhonda following represents Rhonda Fuller's updated allergies and side effects list: Allergies  Allergen Reactions  . Codeine Rash    Big blisters    Rhonda neurologically relevant items on Rhonda Fuller's problem list were reviewed on today's visit.  Neurologic Examination  A problem focused neurological exam (12 or more points of Rhonda single system neurologic examination, vital signs counts as 1 point, cranial nerves count for 8 points) was performed.  Blood pressure 157/84, pulse 77, height 5\' 1"  (1.549 m), weight 134 lb 9.6 oz (61.054 kg).  General - Well nourished, well developed, in no apparent distress, mildly depressed mood with occasionally crying.  Ophthalmologic - Sharp disc margins OU.  Cardiovascular - Regular rate and rhythm with no murmur.  Mental Status -  Level of arousal and orientation to time, place, and person were intact. Language including expression, naming, repetition, comprehension was assessed and found intact. Fund of Knowledge was assessed and was intact.  Cranial Nerves II - XII - II - Visual field intact OU. III, IV, VI - Extraocular movements intact. V - Facial sensation intact bilaterally. VII - Facial movement intact bilaterally. VIII - Hearing & vestibular intact bilaterally. X - Palate elevates symmetrically. XI - Chin turning & shoulder shrug intact bilaterally. XII - Tongue protrusion intact.  Motor Strength - Rhonda Fuller's strength was normal in all extremities and pronator drift was absent.  Bulk was normal and fasciculations were absent.   Motor Tone - Muscle tone was assessed at Rhonda  neck and appendages and was normal.  Reflexes - Rhonda Fuller's reflexes were 1+ in all extremities and Rhonda Fuller had no pathological reflexes.  Sensory - Light touch, temperature/pinprick were assessed and were normal.    Coordination - Rhonda Fuller had normal movements in Rhonda hands and feet with no ataxia or dysmetria.  Tremor was absent.  Gait and Station -  Rhonda Fuller's transfers, posture, gait, station, and turns were observed as normal.  Data reviewed: I personally reviewed Rhonda images and agree with Rhonda radiology interpretations.  Ct Head Wo Contrast 05/26/2015  Probable acute/recent infarct extending from Rhonda superior right lentiform nucleus into Rhonda inferior right centrum semiovale more medially. No hemorrhage or mass effect. Age related volume loss present.   [name] 05/27/2015  MRI HEAD IMPRESSION:  1. Acute ischemic infarct involving Rhonda right lentiform nucleus with superior extension into Rhonda periventricular white matter of Rhonda right corona radiata. No associated hemorrhage or significant mass effect.  2. No other acute intracranial process.  3. Age appropriate cerebral atrophy with mild chronic small vessel ischemic disease.  MRA HEAD IMPRESSION:  1. No large vessel occlusion or hemodynamically significant stenosis within Rhonda intracranial circulation.  2. Mild atheromatous irregularity within Rhonda right M1 segment and distal right MCA branches.  3. Fetal origin of Rhonda right PCA.   CUS - Bilateral: 1-39% ICA stenosis. Vertebral artery flow is antegrade.  2D echo - Left ventricle: Rhonda cavity size was normal. Wall thickness was normal. Systolic function was normal. Rhonda estimated ejection fraction was in Rhonda range of 60% to 65%. Wall motion was normal; there were no regional wall motion abnormalities  Component     Latest Ref Rng 05/27/2015  Cholesterol     0 - 200 mg/dL 161  Triglycerides     <150 mg/dL 096 (H)  HDL Cholesterol     >40 mg/dL 43  Total CHOL/HDL Ratio      4.3  VLDL     0 - 40 mg/dL 41 (H)  LDL (calc)     0 - 99 mg/dL 045 (H)  Hemoglobin W0J     4.8 - 5.6 % 6.2 (H)  Mean Plasma Glucose      131    Assessment: As you may recall, Rhonda Fuller is a 74 y.o. Caucasian female with PMH of hyperlipidemia and hypertension was admitted on 05/26/15 for dysarthria and clumsy left hand. MRI  showed right BG infarct consistent with small vessel disease. Stroke work up including MRA, CUS, 2D echo and A1C unrevealing. LDL 102, increased pravastatin from  to . Pt was discharged in good condition with ASA and pravastatin. Rhonda Fuller has been doing well after discharge, however developed depressed mood and occasionally crying. Rhonda Fuller was put on celexa for depression. However, Rhonda Fuller did not take Celexa currently, still has depressed mood with intermittent crying in clinic.  Plan:  - continue ASA and pravastatin for stroke prevention - check BP at home - Follow up with your primary care physician for stroke risk factor modification. Recommend maintain blood pressure goal <130/80, diabetes with hemoglobin A1c goal below 6.5% and lipids with LDL cholesterol goal below 70 mg/dL.  - recommend to continue Celexa 10 mg daily in Rhonda morning for post stroke depression. If still not tolerating, may consider trial of . - RTC in 6 months.  I spent more than 25 minutes of face to face time with Rhonda Fuller. Greater than 50% of time was spent in counseling and coordination of care. We have  discussed about depression treatment and relaxation and cope with stress.   No orders of Rhonda defined types were placed in this encounter.    Meds ordered this encounter  Medications  . citalopram (CELEXA) 10 MG tablet    Sig: Take 1 tablet (10 mg total) by mouth daily.    Dispense:  30 tablet    Refill:  2    Fuller Instructions  - continue ASA and pravastatin for stroke prevention - check BP at home daily - Follow up with your primary care physician for stroke risk factor modification. Recommend maintain blood pressure goal <130/80, diabetes with hemoglobin A1c goal below 6.5% and lipids with LDL cholesterol goal below 70 mg/dL.  - if cholesterol controlled well, may consider decrease pravastatin to  for a try and to see if your tiredness getting better. - recommend to continue Celexa 10 mg in Rhonda morning  daily for post stroke depression. If not tolerating well, can try  daily. - more relaxation and cope with stress - follow up in 6 months.    Marvel Plan, MD PhD [facility] [address] [place], [place] [address] [phone]

## 2016-01-09 ENCOUNTER — Telehealth: Payer: Self-pay | Admitting: Neurology

## 2016-01-09 MED ORDER — PRAVASTATIN SODIUM 80 MG PO TABS: 80.0000 mg | ORAL_TABLET | Freq: Every day | ORAL | Status: DC

## 2016-01-09 NOTE — Telephone Encounter (Signed)
Pt called said she needs refill for pravastatin (PRAVACHOL) 80 MG tablet . She has 2 tabs left for today and tomorrow. She called [facility] but she was instructed to call provider that had been following her for stroke.

## 2016-05-07 ENCOUNTER — Ambulatory Visit (INDEPENDENT_AMBULATORY_CARE_PROVIDER_SITE_OTHER): Payer: Medicare Other | Admitting: Neurology

## 2016-05-07 ENCOUNTER — Encounter: Payer: Self-pay | Admitting: Neurology

## 2016-05-07 VITALS — Ht 61.0 in | Wt 119.4 lb

## 2016-05-07 DIAGNOSIS — F32A Depression, unspecified: Secondary | ICD-10-CM

## 2016-05-07 DIAGNOSIS — E785 Hyperlipidemia, unspecified: Secondary | ICD-10-CM

## 2016-05-07 DIAGNOSIS — I63311 Cerebral infarction due to thrombosis of right middle cerebral artery: Secondary | ICD-10-CM | POA: Diagnosis not present

## 2016-05-07 DIAGNOSIS — F329 Major depressive disorder, single episode, unspecified: Secondary | ICD-10-CM

## 2016-05-07 DIAGNOSIS — I10 Essential (primary) hypertension: Secondary | ICD-10-CM | POA: Diagnosis not present

## 2016-05-07 NOTE — Patient Instructions (Signed)
-   continue ASA and pravastatin for stroke prevention - check BP at home - recommend to repeat lipid panel next time with Dr. Jeannetta NapElkins - Follow up with your primary care physician for stroke risk factor modification. Recommend maintain blood pressure goal <130/80, diabetes with hemoglobin A1c goal below 6.5% and lipids with LDL cholesterol goal below 70 mg/dL.  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 - follow up as needed.

## 2016-05-07 NOTE — Progress Notes (Signed)
STROKE NEUROLOGY FOLLOW UP NOTE  NAME: Rhonda Fuller DOB: Jan 20, 1941  REASON FOR VISIT: stroke follow up HISTORY FROM: pt and chart  Today we had the pleasure of seeing Rhonda Fuller in follow-up at our Neurology [facility]. Pt was accompanied by no one.   History Summary [name] is a 75 y.o. female with history of hyperlipidemia and hypertension was admitted on 05/26/15 for dysarthria and clumsy left hand. 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 Stroke work up including MRA, CUS, 2D echo and A1C unrevealing. LDL 102, increased pravastatin from 40mg  to 80mg . Pt was discharged in good condition with ASA and pravastatin and outpt PT.   07/30/15 follow up - the patient has been doing well. Dysarthria and clumsy left hand resolved. No recurrent stroke like symptoms. However, she was having depressed mood whenever she thinks about her stroke and she has sister living in [place] due to recurrent strokes. She cries frequently at home and also in tears in clinic.  BP 150/70 in clinic.  11/06/15 follow up - pt has been doing well. No recurrent stroke symptoms. However, she only took celexa one time in the afternoon and caused her insomnia, so she did not take it further. She still has some depressed mood and worrying about her sister who is living in [place] now and had 4 falls in the last month and nonverbal. She still feels tired most of the time. She is going to see her PCP next week. Her BP at home 120-130s but today in clinic 152/84.   Interval History During the interval time, pt has been doing well. Currently taking celexa 5mg  for depression and tolerating well. No cry spells today and depression seems under control. Her BP at home are well controlled. She stated that PCP would like to decrease pravastatin to 40mg  but she has not had recent lipid panel repeat yet. She is back to drive and sometimes needs to drive at night.   REVIEW OF SYSTEMS: Full 14 system review of systems  performed and notable only for those listed below and in HPI above, all others are negative:  Constitutional:   Cardiovascular:  Ear/Nose/Throat:   Skin:  Eyes:   Respiratory:   Gastroitestinal:   Genitourinary:  Hematology/Lymphatic:   Endocrine: Musculoskeletal:   Allergy/Immunology:   Neurological:   Psychiatric:  Sleep:   The following represents the patient's updated allergies and side effects list: Allergies  Allergen Reactions  . Codeine Rash    Big blisters    The neurologically relevant items on the patient's problem list were reviewed on today's visit.  Neurologic Examination  A problem focused neurological exam (12 or more points of the single system neurologic examination, vital signs counts as 1 point, cranial nerves count for 8 points) was performed.  Height 5\' 1"  (1.549 m), weight 119 lb 6.4 oz (54.159 kg).  General - Well nourished, well developed, in no apparent distress.  Ophthalmologic - Sharp disc margins OU.  Cardiovascular - Regular rate and rhythm with no murmur.  Mental Status -  Level of arousal and orientation to time, place, and person were intact. Language including expression, naming, repetition, comprehension was assessed and found intact. Fund of Knowledge was assessed and was intact.  Cranial Nerves II - XII - II - Visual field intact OU. III, IV, VI - Extraocular movements intact. V - Facial sensation intact bilaterally. VII - Facial movement intact bilaterally. VIII - Hearing & vestibular intact bilaterally. X -  Palate elevates symmetrically. XI - Chin turning & shoulder shrug intact bilaterally. XII - Tongue protrusion intact.  Motor Strength - The patient's strength was normal in all extremities and pronator drift was absent.  Bulk was normal and fasciculations were absent.   Motor Tone - Muscle tone was assessed at the neck and appendages and was normal.  Reflexes - The patient's reflexes were 1+ in all extremities and she  had no pathological reflexes.  Sensory - Light touch, temperature/pinprick were assessed and were normal.    Coordination - The patient had normal movements in the hands and feet with no ataxia or dysmetria.  Tremor was absent.  Gait and Station - The patient's transfers, posture, gait, station, and turns were observed as normal.  Data reviewed: I personally reviewed the images and agree with the radiology interpretations.  Ct Head Wo Contrast 05/26/2015  Probable acute/recent infarct extending from the superior right lentiform nucleus into the inferior right centrum semiovale more medially. No hemorrhage or mass effect. Age related volume loss present.   [name] 05/27/2015  MRI HEAD IMPRESSION:  1. 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 No associated hemorrhage or significant mass effect.  2. No other acute intracranial process.  3. Age appropriate cerebral atrophy with mild chronic small vessel ischemic disease.  MRA HEAD IMPRESSION:  1. No large vessel occlusion or hemodynamically significant stenosis within the intracranial circulation.  2. Mild atheromatous irregularity within the right M1 segment and distal right MCA branches.  3. Fetal origin of the right PCA.   CUS - Bilateral: 1-39% ICA stenosis. Vertebral artery flow is antegrade.  2D echo - Left ventricle: The cavity size was normal. Wall thickness was normal. Systolic function was normal. The estimated ejection fraction was in the range of 60% to 65%. Wall motion was normal; there were no regional wall motion abnormalities  Component     Latest Ref Rng 05/27/2015  Cholesterol     0 - 200 mg/dL 960186  Triglycerides     <150 mg/dL 454203 (H)  HDL Cholesterol     >40 mg/dL 43  Total CHOL/HDL Ratio      4.3  VLDL     0 - 40 mg/dL 41 (H)  LDL (calc)     0 - 99 mg/dL 098102 (H)    Hemoglobin J1BA1C     4.8 - 5.6 % 6.2 (H)  Mean Plasma Glucose      131    Assessment: As you may recall, she is a 75 y.o. Caucasian female with PMH of hyperlipidemia and hypertension was admitted on 05/26/15 for dysarthria and clumsy left hand. 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 Stroke work up including MRA, CUS, 2D echo and A1C unrevealing. LDL 102, increased pravastatin from 40mg  to 80mg . Pt was discharged in good condition with ASA and pravastatin. She has been doing well after discharge, however developed depressed mood and occasionally crying. She was put on celexa for depression. Currently takes 5mg  daily. Depression seems under control. Plan:  - continue ASA and pravastatin for stroke prevention - check BP at home - recommend to repeat lipid panel next time with Dr. Jeannetta NapElkins - Follow up with your primary care physician for stroke risk factor modification. Recommend maintain blood pressure goal <130/80, diabetes with hemoglobin A1c goal below 6.5% and lipids with LDL cholesterol goal below 70 mg/dL.  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 - follow up as needed.    No orders of the defined types were placed in this encounter.    Meds ordered this encounter  Medications  . DISCONTD: pravastatin (PRAVACHOL) 40 MG tablet    Sig: Reported on 05/07/2016    Patient Instructions  - continue ASA and pravastatin for stroke prevention - check BP at home - recommend to repeat lipid panel next time with Dr. Jeannetta Nap - Follow up with your primary care physician for stroke risk factor modification. Recommend maintain blood pressure goal <130/80, diabetes with hemoglobin A1c goal below 6.5% and lipids with LDL cholesterol goal below 70 mg/dL.  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 - follow up as needed.    Marvel Plan, MD PhD [facility] [address] [place], [place]  [address] [phone]

## 2016-06-01 ENCOUNTER — Other Ambulatory Visit: Payer: Self-pay | Admitting: Neurology

## 2016-06-02 ENCOUNTER — Other Ambulatory Visit: Payer: Self-pay

## 2016-06-02 DIAGNOSIS — F329 Major depressive disorder, single episode, unspecified: Secondary | ICD-10-CM

## 2016-06-02 DIAGNOSIS — F32A Depression, unspecified: Secondary | ICD-10-CM

## 2016-06-02 MED ORDER — CITALOPRAM HYDROBROMIDE 10 MG PO TABS: 10.0000 mg | ORAL_TABLET | Freq: Every day | ORAL | Status: AC

## 2016-06-02 MED ORDER — CITALOPRAM HYDROBROMIDE 10 MG PO TABS: 10.0000 mg | ORAL_TABLET | Freq: Every day | ORAL | Status: DC

## 2016-06-15 ENCOUNTER — Other Ambulatory Visit: Payer: Self-pay | Admitting: Neurology

## 2016-06-16 ENCOUNTER — Other Ambulatory Visit: Payer: Self-pay

## 2016-06-16 MED ORDER — PRAVASTATIN SODIUM 80 MG PO TABS: 80.0000 mg | ORAL_TABLET | Freq: Every day | ORAL | Status: DC

## 2016-08-26 IMAGING — MR MR MRA HEAD W/O CM
9 of 11 series · 30 of 48 positions shown · non-contrast
Comparison: Prior CT from earlier the same day.

CLINICAL DATA: Initial evaluation for acute dysarthria since this
morning.

EXAM:
MRI HEAD WITHOUT CONTRAST
MRA HEAD WITHOUT CONTRAST
TECHNIQUE: Multiplanar, multiecho pulse sequences of the brain and surrounding
structures were obtained without intravenous contrast. Angiographic
images of the head were obtained using MRA technique without
contrast.

[Series 3: DWI · axial · 3.0mm · 1.09mm/px · z∈[-90,+42]mm · 7 of 92 slices shown (1 of 4)]
[im 1/92]
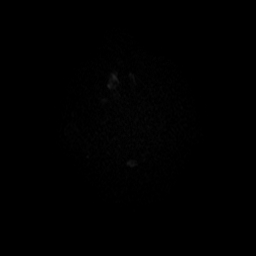
[im 16/92]
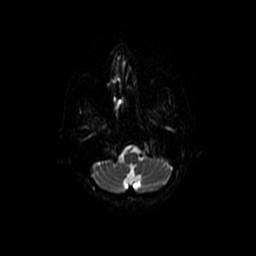
[im 31/92]
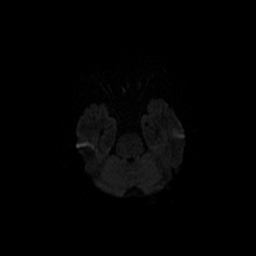
[im 46/92]
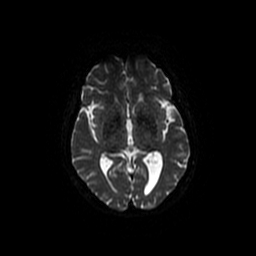
[im 61/92]
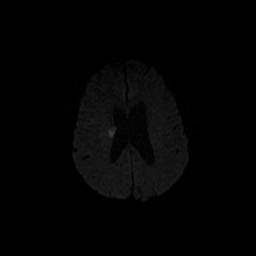
[im 76/92]
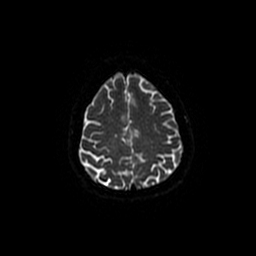
[im 92/92]
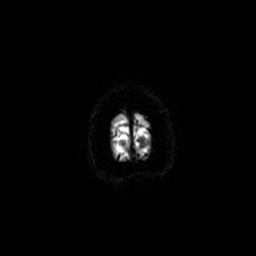

[Series 4: (id) mt fs · axial · 1.4mm · 0.43mm/px · z∈[-96,-68]mm · 3 of 154 slices shown]
[im 1/154]
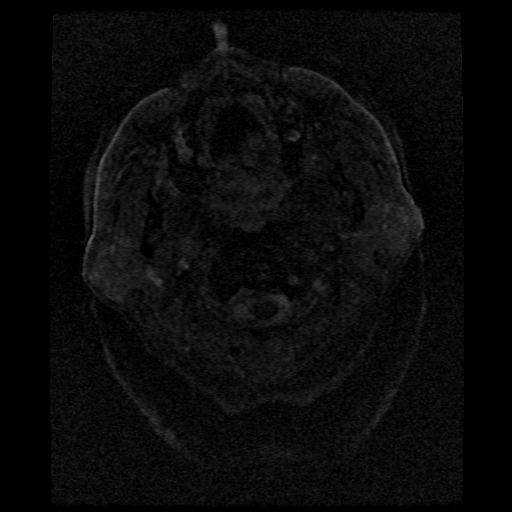
[im 28/154]
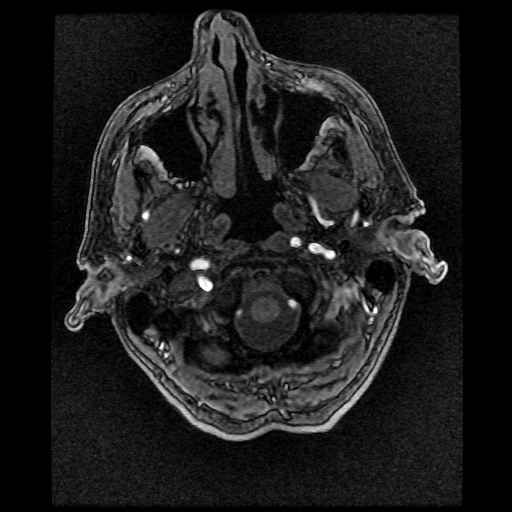
[im 42/154]
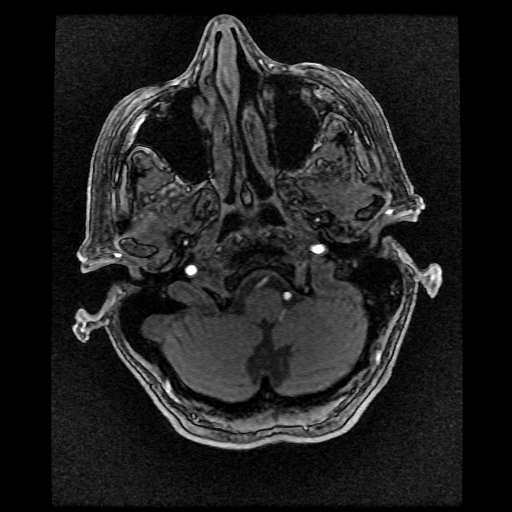

[Series 5: DWI · coronal · 5.0mm · 1.09mm/px · 5 of 66 slices shown (2 of 4)]
[im 1/66]
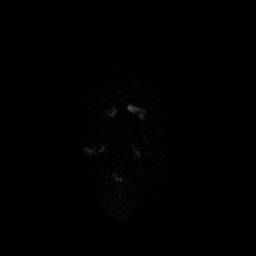
[im 17/66]
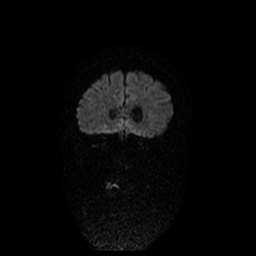
[im 33/66]
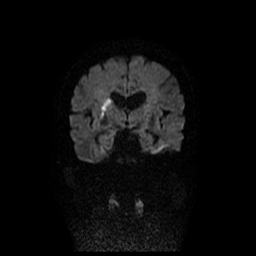
[im 49/66]
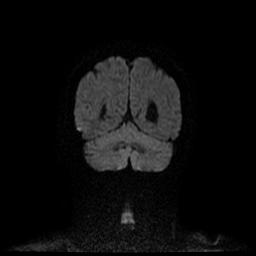
[im 66/66]
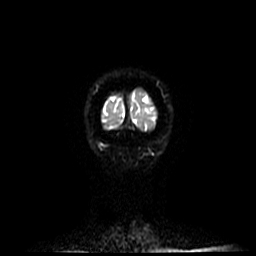

[Series 6: T1 · sagittal · 5.0mm · 0.47mm/px · 2 of 23 slices shown]
[im 1/23]
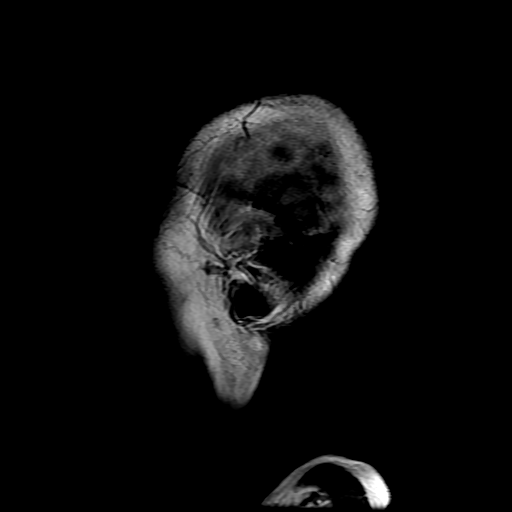
[im 23/23]
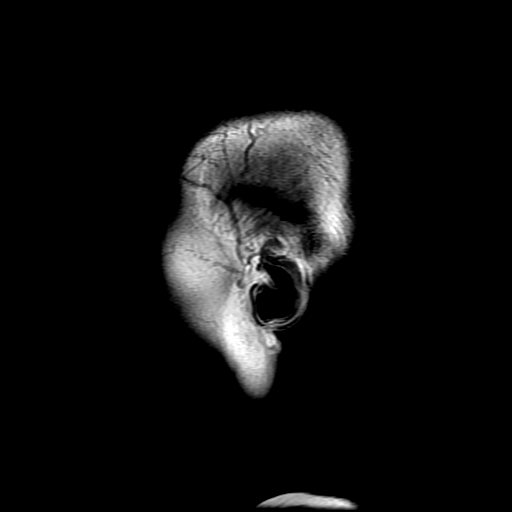

[Series 7: T2 · axial · 5.0mm · 0.43mm/px · z∈[-82,+47]mm · 2 of 23 slices shown (1 of 2)]
[im 1/23]
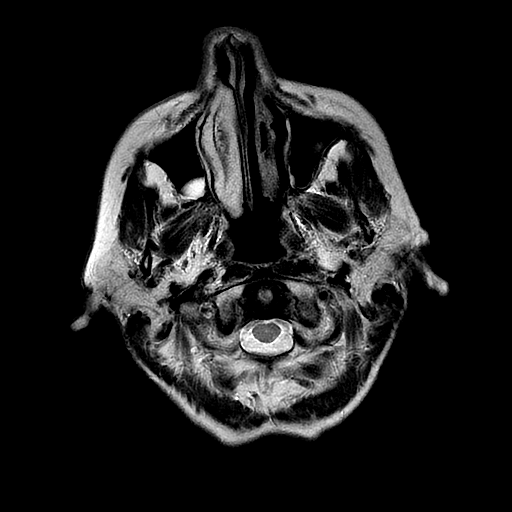
[im 23/23]
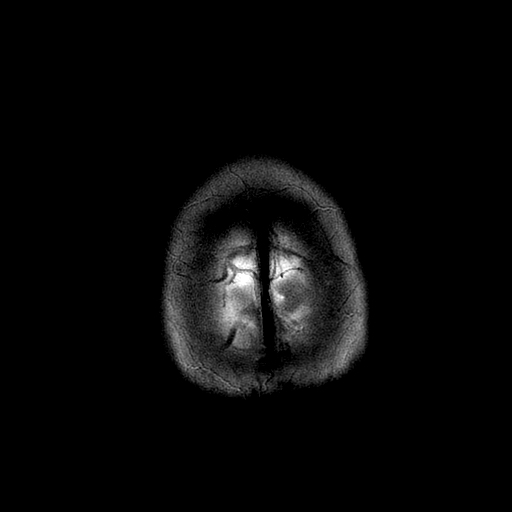

[Series 8: FLAIR · axial · 5.0mm · 0.43mm/px · z∈[-82,+47]mm · 2 of 23 slices shown]
[im 1/23]
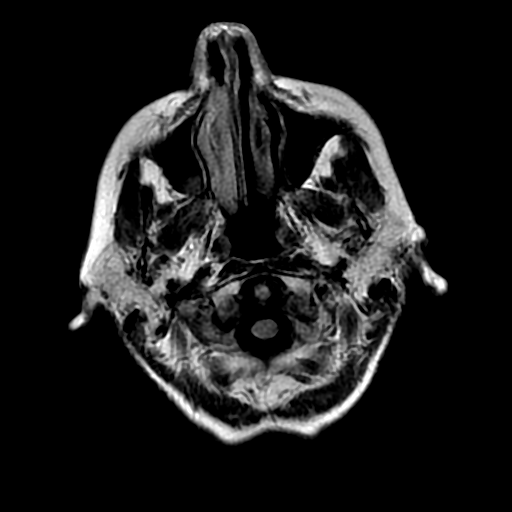
[im 23/23]
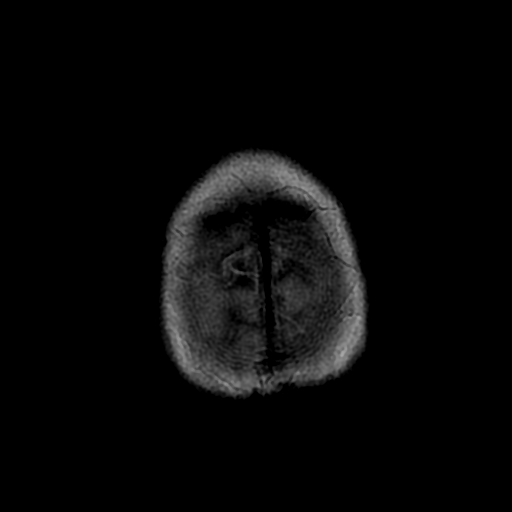

[Series 11: T2 · coronal · 5.0mm · 0.43mm/px · 2 of 28 slices shown (2 of 2)]
[im 1/28]
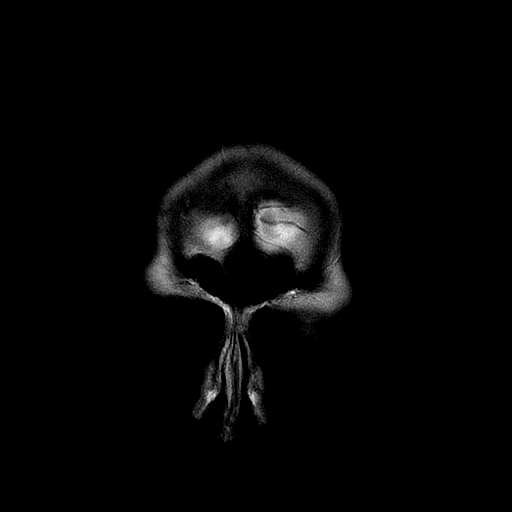
[im 28/28]
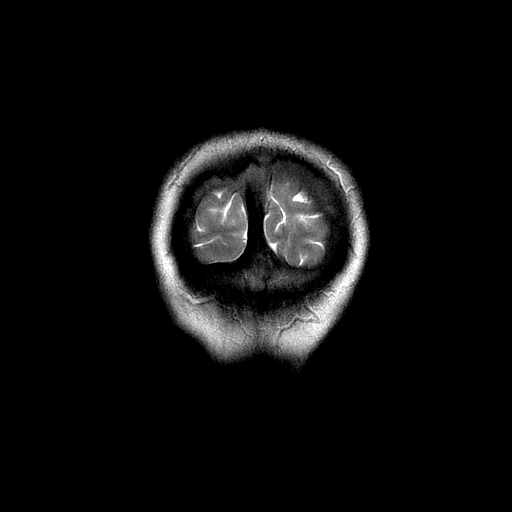

[Series 300: DWI · axial · 3.0mm · 1.09mm/px · z∈[-90,+42]mm · 4 of 46 slices shown (3 of 4)]
[im 1/46]
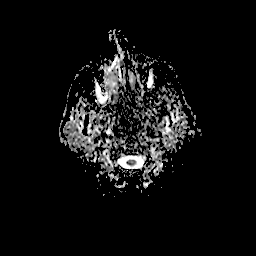
[im 16/46]
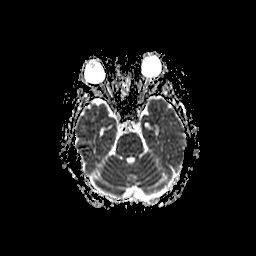
[im 31/46]
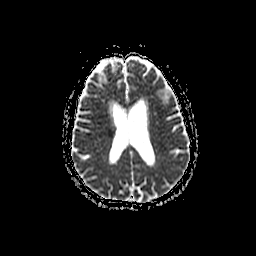
[im 46/46]
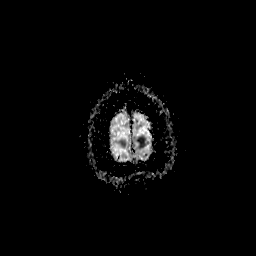

[Series 500: DWI · coronal · 5.0mm · 1.09mm/px · 3 of 33 slices shown (4 of 4)]
[im 1/33]
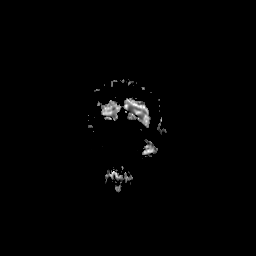
[im 17/33]
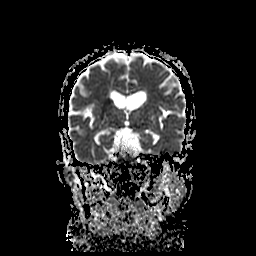
[im 33/33]
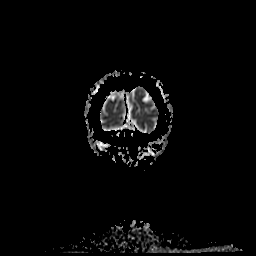

[30 of 48 positions shown; findings below may reference images not displayed]

FINDINGS: MRI HEAD FINDINGS

Age appropriate cerebral volume loss is present. Patchy and
confluent T2/FLAIR hyperintensity within the periventricular and
deep white matter present, most compatible with chronic small vessel
ischemic changes, mild for patient age.

There is an abnormal focus of restricted diffusion involving the
right lentiform nucleus with superior extension into the
periventricular white matter of the right corona radiata act,
compatible with acute ischemic infarct. This corresponds with
previously noted hypodensity as seen on prior CT. No associated
hemorrhage or significant mass effect. No other infarct. Gray-white
matter differentiation is otherwise maintained. Normal intravascular
flow voids preserved. No acute or chronic intracranial hemorrhage.

No mass lesion or midline shift. No hydrocephalus. No extra-axial
fluid collection.

Craniocervical junction within normal limits. Reversal of the normal
cervical lordosis with multilevel degenerative changes noted within
the visualized upper cervical spine.

Pituitary gland normal.

No acute abnormality about the orbits.

Mild polypoid opacity present within the inferior maxillary sinuses
bilaterally, slightly greater on the right. Mild mucosal thickening
within the ethmoidal air cells. Paranasal sinuses are otherwise
clear. Scattered opacity present within the left mastoid air cells
without frank effusion. Inner ear structures normal.

Bone marrow signal intensity within normal limits. Scalp soft
tissues unremarkable.

MRA HEAD FINDINGS

ANTERIOR CIRCULATION:

Visualized distal cervical segments of the internal carotid arteries
are widely patent with antegrade flow. The petrous, cavernous, and
supra clinoid segments are widely patent. A1 segments, anterior
communicating artery, and anterior cerebral arteries well opacified.
M1 segments demonstrate no high-grade stenosis or occlusion. There
is early bifurcation of the left M1 segment. Mild atheromatous
irregularity present within the right M1 segment. Distal MCA
branches well opacified bilaterally. There is mild asymmetric
atheromatous irregularity within the distal right MCA branches as
compared to the left.

POSTERIOR CIRCULATION:

Left vertebral artery is dominant and widely patent to the
vertebrobasilar junction. The diminutive right vertebral artery
patent to the vertebrobasilar junction as well. Posterior inferior
cerebral arteries are opacified bilaterally. Basilar artery mildly
tortuous but widely patent. Superior cerebellar and posterior
cerebral arteries well opacified bilaterally. Fetal origin of the
right PCA with widely patent right posterior communicating artery.

No aneurysm or vascular malformation.
IMPRESSION: MRI HEAD IMPRESSION:

1. Acute ischemic infarct involving the right lentiform nucleus with
superior extension into the periventricular white matter of the
right corona radiata. No associated hemorrhage or significant mass
effect.
2. No other acute intracranial process.
3. Age appropriate cerebral atrophy with mild chronic small vessel
ischemic disease.

MRA HEAD IMPRESSION:

1. No large vessel occlusion or hemodynamically significant stenosis
within the intracranial circulation.
2. Mild atheromatous irregularity within the right M1 segment and
distal right MCA branches.
3. Fetal origin of the right PCA.

## 2016-08-26 IMAGING — CT CT HEAD W/O CM
1 series · 16 of 30 positions shown, 20 images · non-contrast
Comparison: None.

CLINICAL DATA: One day history of left-sided weakness and aphasia

EXAM:
CT HEAD WITHOUT CONTRAST
TECHNIQUE: Contiguous axial images were obtained from the base of the skull
through the vertex without intravenous contrast.

[Series 2: head 5.0 h30s · axial · 0.42mm/px · z∈[-184,-49]mm · 16 of 30 slices shown, 20 images]
[im 2/30  brain]
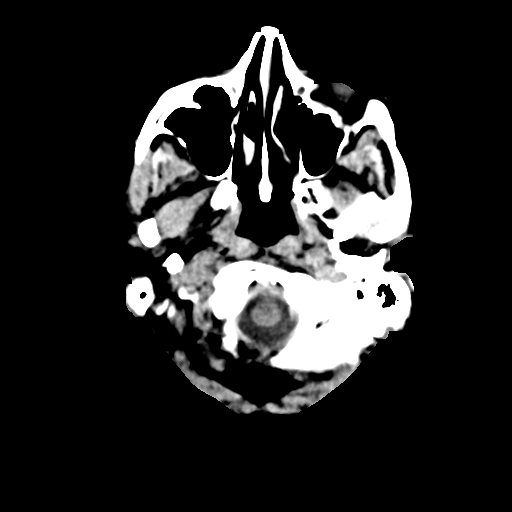
[im 2/30  bone]
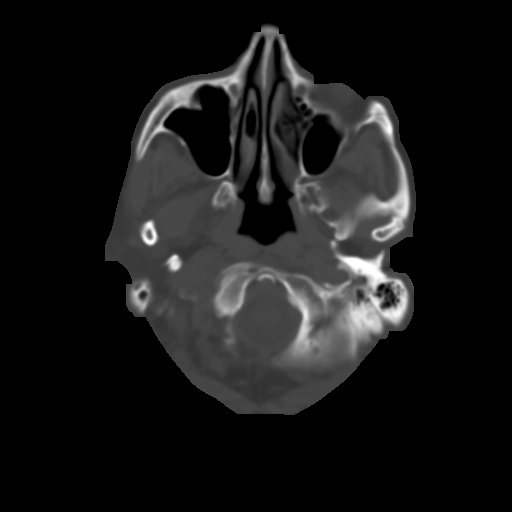
[im 4/30  brain]
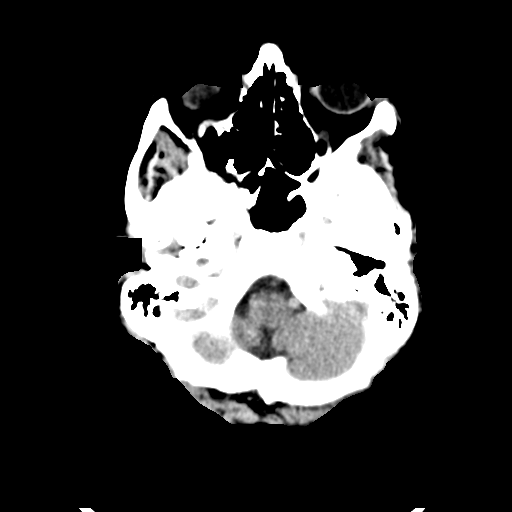
[im 6/30  brain]
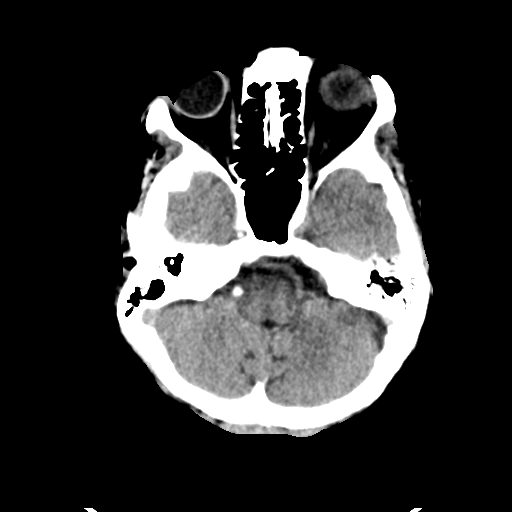
[im 8/30  brain]
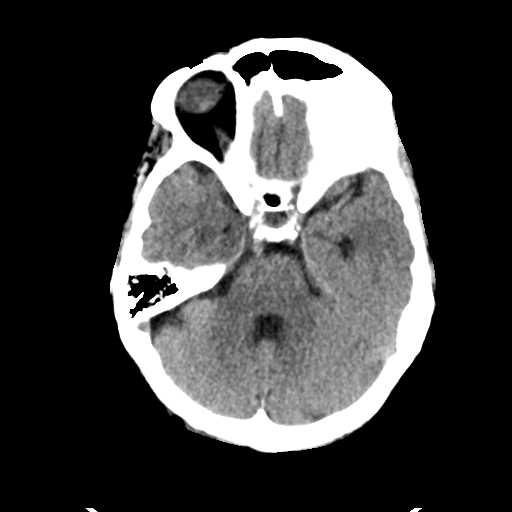
[im 9/30  brain]
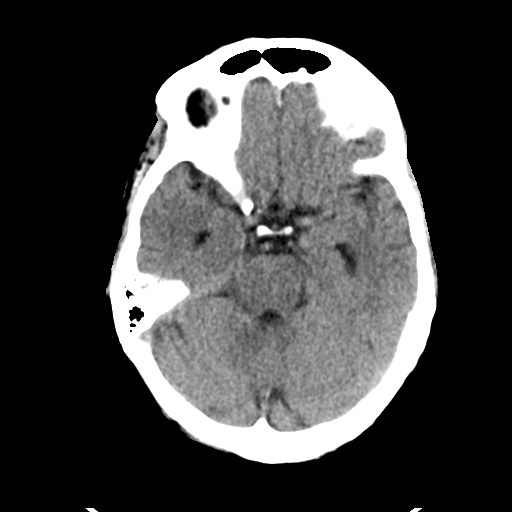
[im 9/30  bone]
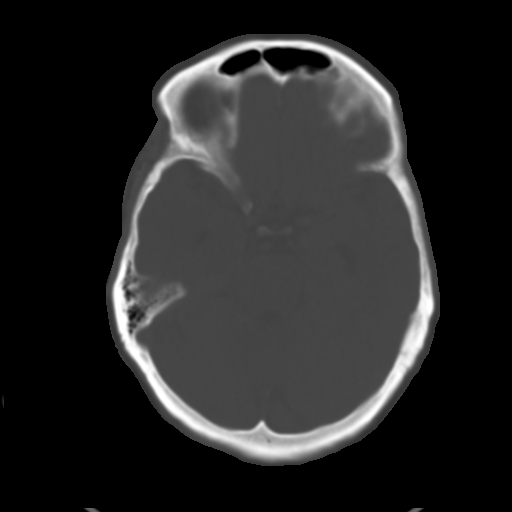
[im 11/30  brain]
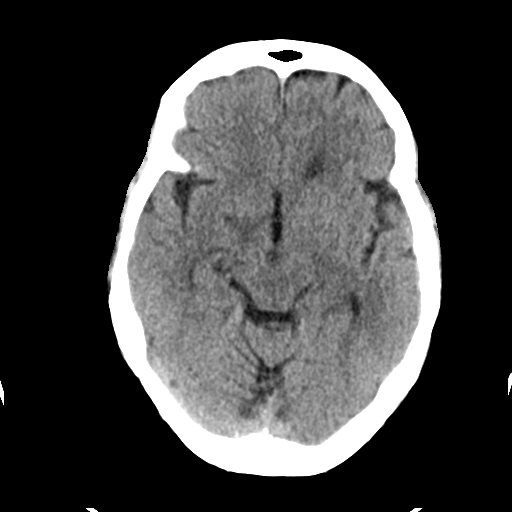
[im 13/30  brain]
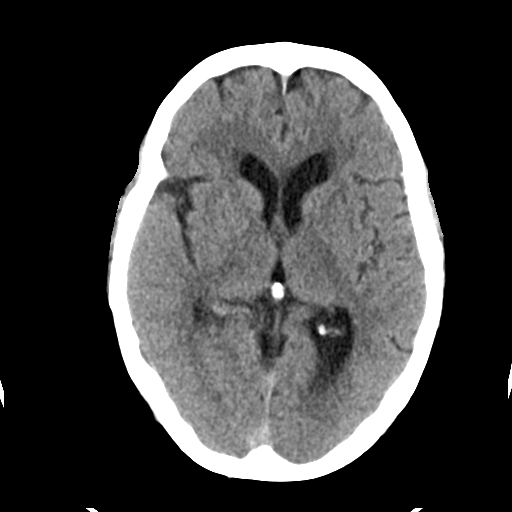
[im 15/30  brain]
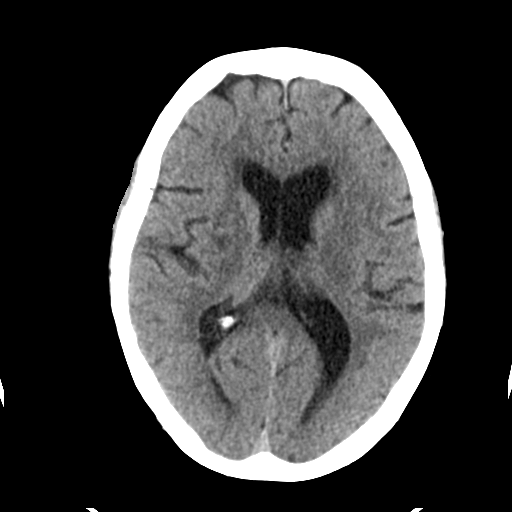
[im 16/30  brain]
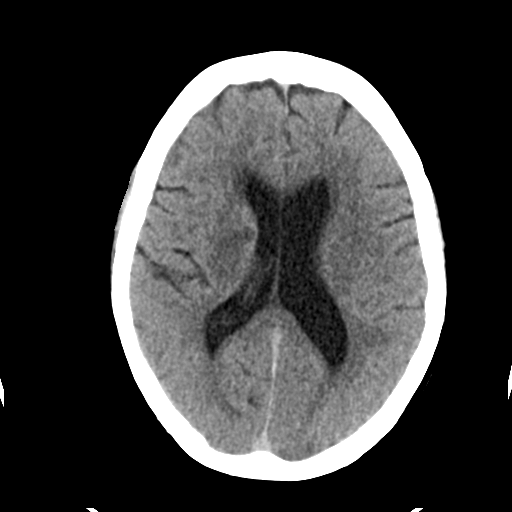
[im 16/30  bone]
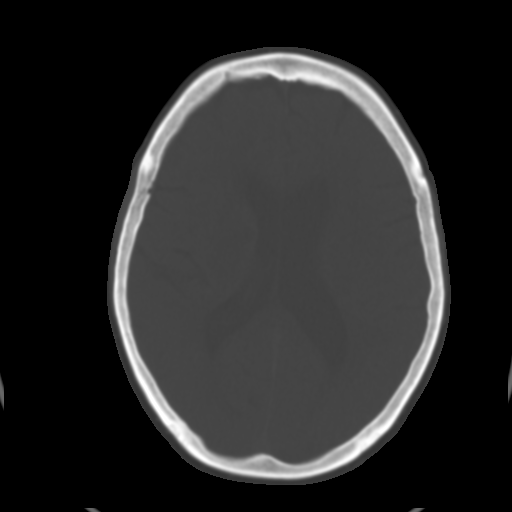
[im 18/30  brain]
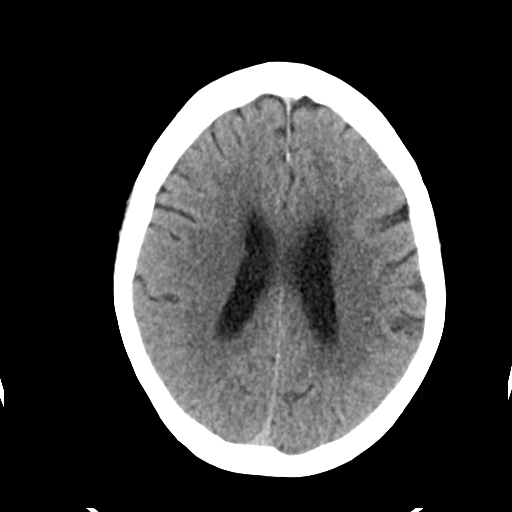
[im 20/30  brain]
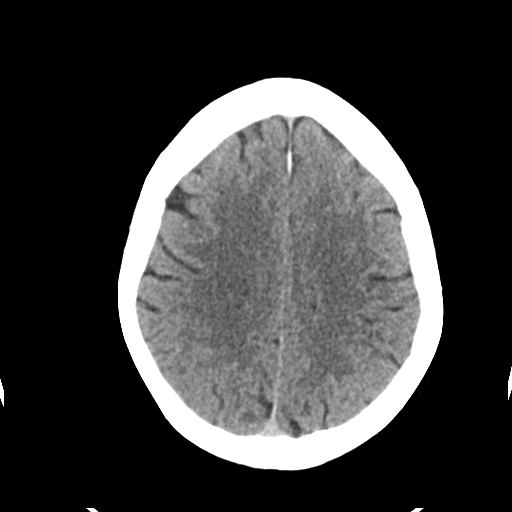
[im 22/30  brain]
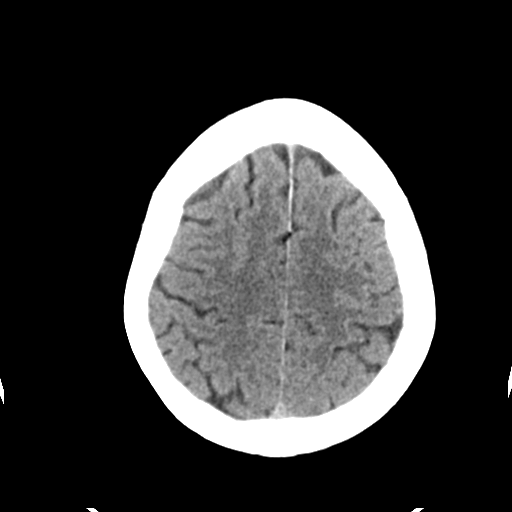
[im 23/30  brain]
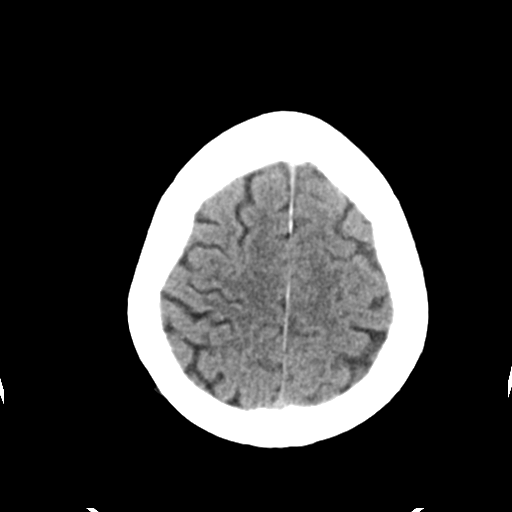
[im 23/30  bone]
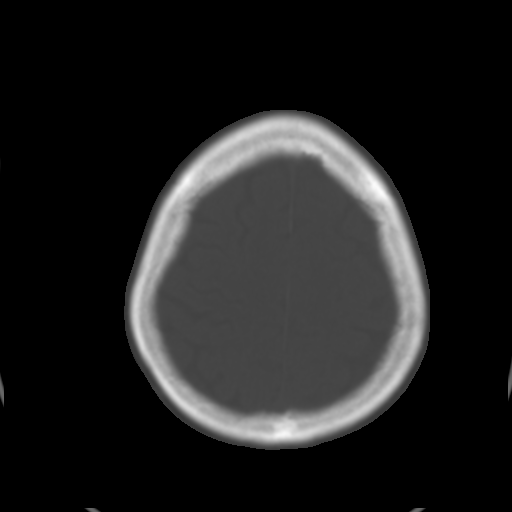
[im 25/30  brain]
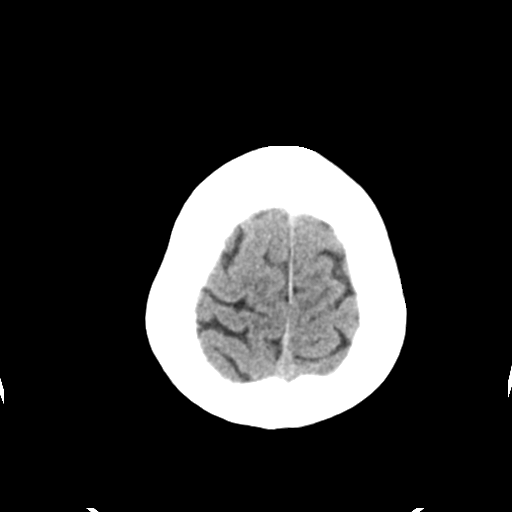
[im 27/30  brain]
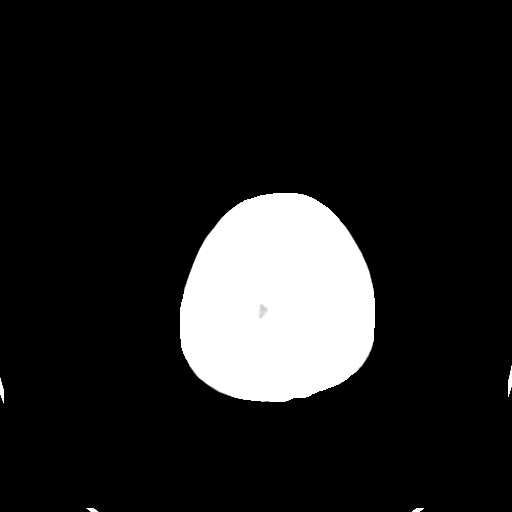
[im 29/30  brain]
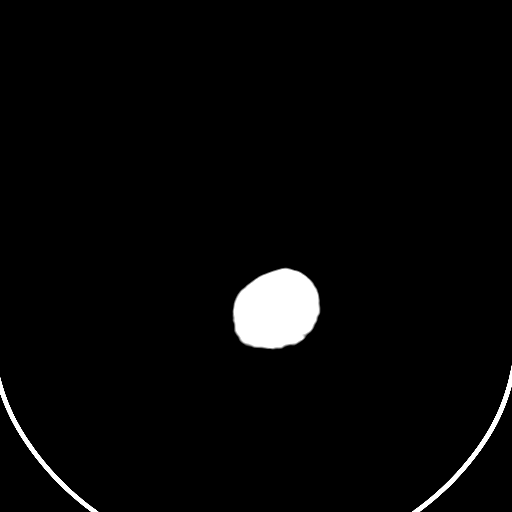

[16 of 30 positions shown; findings below may reference images not displayed]

FINDINGS: There is age related volume loss. The left lateral ventricle is
slightly larger than the right lateral ventricle, an apparent
anatomic variant. There is no intracranial mass, hemorrhage,
extra-axial fluid collection, or midline shift. There is decreased
attenuation extending from the superior right lentiform nucleus into
the inferior right centrum semiovale. This finding is suspicious for
an early/acute infarct. Elsewhere, gray-white compartments appear
normal. Bony calvarium appears intact. The mastoid air cells are
clear.
IMPRESSION: Probable acute/recent infarct extending from the superior right
lentiform nucleus into the inferior right centrum semiovale more
medially. No hemorrhage or mass effect. Age related volume loss
present.

## 2016-09-22 ENCOUNTER — Other Ambulatory Visit: Payer: Self-pay | Admitting: Neurology

## 2016-12-27 ENCOUNTER — Other Ambulatory Visit: Payer: Self-pay | Admitting: Neurology

## 2016-12-30 ENCOUNTER — Other Ambulatory Visit: Payer: Self-pay

## 2016-12-30 MED ORDER — PRAVASTATIN SODIUM 80 MG PO TABS: 80.0000 mg | ORAL_TABLET | Freq: Every day | ORAL | 0 refills | Status: AC

## 2016-12-31 ENCOUNTER — Other Ambulatory Visit: Payer: Self-pay | Admitting: Neurology

## 2017-01-28 ENCOUNTER — Other Ambulatory Visit: Payer: Self-pay | Admitting: Neurology

## 2017-02-04 ENCOUNTER — Other Ambulatory Visit: Payer: Self-pay | Admitting: Neurology

## 2017-02-05 NOTE — Telephone Encounter (Signed)
Pt last seen 04/2016. PT was told to follow up as needed per note. PT needs to contact her PCP for refills on pravastatin and celexa.

## 2017-03-01 ENCOUNTER — Other Ambulatory Visit: Payer: Self-pay | Admitting: Neurology

## 2017-03-04 ENCOUNTER — Other Ambulatory Visit: Payer: Self-pay
